# Patient Record
Sex: Female | Born: 1945
Health system: Southern US, Community
[De-identification: ages and names within clinical notes are randomized; demographics above are authoritative.]

## PROBLEM LIST (undated history)

## (undated) DIAGNOSIS — I2510 Atherosclerotic heart disease of native coronary artery without angina pectoris: Secondary | ICD-10-CM

## (undated) DIAGNOSIS — E7800 Pure hypercholesterolemia, unspecified: Secondary | ICD-10-CM

## (undated) DIAGNOSIS — F329 Major depressive disorder, single episode, unspecified: Secondary | ICD-10-CM

## (undated) DIAGNOSIS — M1990 Unspecified osteoarthritis, unspecified site: Secondary | ICD-10-CM

## (undated) DIAGNOSIS — F419 Anxiety disorder, unspecified: Secondary | ICD-10-CM

## (undated) DIAGNOSIS — I10 Essential (primary) hypertension: Secondary | ICD-10-CM

## (undated) DIAGNOSIS — K219 Gastro-esophageal reflux disease without esophagitis: Secondary | ICD-10-CM

## (undated) DIAGNOSIS — Z8249 Family history of ischemic heart disease and other diseases of the circulatory system: Secondary | ICD-10-CM

## (undated) DIAGNOSIS — E785 Hyperlipidemia, unspecified: Secondary | ICD-10-CM

## (undated) DIAGNOSIS — F32A Depression, unspecified: Secondary | ICD-10-CM

## (undated) HISTORY — DX: Hyperlipidemia, unspecified: E78.5

## (undated) HISTORY — PX: BREAST SURGERY: SHX581

## (undated) HISTORY — PX: EYE SURGERY: SHX253

## (undated) HISTORY — DX: Pure hypercholesterolemia, unspecified: E78.00

## (undated) HISTORY — PX: CATARACT EXTRACTION: SUR2

## (undated) HISTORY — DX: Atherosclerotic heart disease of native coronary artery without angina pectoris: I25.10

## (undated) HISTORY — DX: Family history of ischemic heart disease and other diseases of the circulatory system: Z82.49

---

## 1998-09-26 ENCOUNTER — Encounter: Payer: Self-pay | Admitting: Surgery

## 1998-09-26 ENCOUNTER — Ambulatory Visit (HOSPITAL_BASED_OUTPATIENT_CLINIC_OR_DEPARTMENT_OTHER): Admission: RE | Admit: 1998-09-26 | Discharge: 1998-09-26 | Payer: Self-pay | Admitting: Surgery

## 1998-12-20 ENCOUNTER — Other Ambulatory Visit: Admission: RE | Admit: 1998-12-20 | Discharge: 1998-12-20 | Payer: Self-pay | Admitting: Gynecology

## 2000-01-09 ENCOUNTER — Other Ambulatory Visit: Admission: RE | Admit: 2000-01-09 | Discharge: 2000-01-09 | Payer: Self-pay | Admitting: Gynecology

## 2001-02-08 ENCOUNTER — Other Ambulatory Visit: Admission: RE | Admit: 2001-02-08 | Discharge: 2001-02-08 | Payer: Self-pay | Admitting: Gynecology

## 2002-02-15 ENCOUNTER — Other Ambulatory Visit: Admission: RE | Admit: 2002-02-15 | Discharge: 2002-02-15 | Payer: Self-pay | Admitting: Gynecology

## 2002-07-04 ENCOUNTER — Ambulatory Visit (HOSPITAL_COMMUNITY): Admission: RE | Admit: 2002-07-04 | Discharge: 2002-07-04 | Payer: Self-pay | Admitting: Gastroenterology

## 2002-10-13 ENCOUNTER — Encounter: Payer: Self-pay | Admitting: Emergency Medicine

## 2002-10-13 ENCOUNTER — Encounter: Admission: RE | Admit: 2002-10-13 | Discharge: 2002-10-13 | Payer: Self-pay | Admitting: Emergency Medicine

## 2003-02-21 ENCOUNTER — Other Ambulatory Visit: Admission: RE | Admit: 2003-02-21 | Discharge: 2003-02-21 | Payer: Self-pay | Admitting: Gynecology

## 2003-04-24 ENCOUNTER — Encounter: Admission: RE | Admit: 2003-04-24 | Discharge: 2003-04-24 | Payer: Self-pay | Admitting: Emergency Medicine

## 2005-04-26 ENCOUNTER — Encounter: Admission: RE | Admit: 2005-04-26 | Discharge: 2005-04-26 | Payer: Self-pay | Admitting: Emergency Medicine

## 2006-12-17 ENCOUNTER — Encounter: Admission: RE | Admit: 2006-12-17 | Discharge: 2006-12-17 | Payer: Self-pay | Admitting: Emergency Medicine

## 2008-06-08 ENCOUNTER — Other Ambulatory Visit: Admission: RE | Admit: 2008-06-08 | Discharge: 2008-06-08 | Payer: Self-pay | Admitting: Family Medicine

## 2008-12-07 ENCOUNTER — Ambulatory Visit: Payer: Self-pay | Admitting: Diagnostic Radiology

## 2008-12-07 ENCOUNTER — Emergency Department (HOSPITAL_BASED_OUTPATIENT_CLINIC_OR_DEPARTMENT_OTHER): Admission: EM | Admit: 2008-12-07 | Discharge: 2008-12-08 | Payer: Self-pay | Admitting: Emergency Medicine

## 2009-01-01 ENCOUNTER — Encounter: Admission: RE | Admit: 2009-01-01 | Discharge: 2009-02-19 | Payer: Self-pay | Admitting: Orthopedic Surgery

## 2009-07-12 ENCOUNTER — Other Ambulatory Visit: Admission: RE | Admit: 2009-07-12 | Discharge: 2009-07-12 | Payer: Self-pay | Admitting: Family Medicine

## 2010-03-25 ENCOUNTER — Encounter: Payer: Self-pay | Admitting: Emergency Medicine

## 2010-07-16 ENCOUNTER — Other Ambulatory Visit: Payer: Self-pay | Admitting: Family Medicine

## 2010-07-16 ENCOUNTER — Other Ambulatory Visit (HOSPITAL_COMMUNITY)
Admission: RE | Admit: 2010-07-16 | Discharge: 2010-07-16 | Disposition: A | Discharge status: Home / Self Care | Payer: 59 | Source: Ambulatory Visit | Attending: Family Medicine | Admitting: Family Medicine

## 2010-07-16 DIAGNOSIS — Z124 Encounter for screening for malignant neoplasm of cervix: Secondary | ICD-10-CM | POA: Insufficient documentation

## 2010-07-19 NOTE — Op Note (Signed)
NAMEViann Contreras                           ACCOUNT NO.:  [id number]   MEDICAL RECORD NO.:  [id number]                   PATIENT TYPE:  AMB   LOCATION:  ENDO                                 FACILITY:  [facility]   PHYSICIAN:  Anselmo Rod, M.D.               DATE OF BIRTH:  Oct 26, 1945   DATE OF PROCEDURE:  07/04/2002  DATE OF DISCHARGE:                                 OPERATIVE REPORT   PROCEDURE PERFORMED:  Screening colonoscopy.   ENDOSCOPIST:  Anselmo Rod, M.D.   INSTRUMENT USED:  Olympus video colonoscope.   INDICATION FOR PROCEDURE:  [age]-year-old white female with a history  of occasional BRBPR, rule out colon polyps, masses, etc.   PREPROCEDURE PREPARATION:  Informed consent was procured from the patient.  The patient was fasted for eight hours prior to the procedure and prepped  with a bottle of MiraLax and Gatorade the night prior to the procedure.   PREPROCEDURE PHYSICAL:  VITAL SIGNS:  The patient had stable vital signs.  NECK:  Neck supple.  CHEST:  Chest clear to auscultation.  S1 and S2 regular.  ABDOMEN:  Abdomen soft with normal bowel sounds.   DESCRIPTION OF THE PROCEDURE:  The patient was placed in the left lateral  decubitus position and sedated with 100 mg of Demerol and 10 mg of Versed  intravenously.  Once the patient was adequately sedate and maintained on low-  flow oxygen and continuous cardiac monitoring, the Olympus video colonoscope  was advanced from the rectum to the cecum with difficulty; there was a large  amount of residual stool in the colon and multiple washings were done.  The  appendicular orifice and ileocecal valve were clearly visualized and  photographed.  No masses, polyps, erosions, ulcerations or diverticula were  seen.  Small internal hemorrhoids were seen on retroflexion in the rectum  and small lesions could have been missed secondary to relatively poor prep.   IMPRESSION:  1. No masses or polyps seen.  No evidence  of diverticulosis.  2. Small non-bleeding internal hemorrhoids.    RECOMMENDATIONS:  1. A high-fiber diet with 20 to 25 g of fiber in her diet and liberal fluid     intake have been advocated.  2. Repeat colorectal cancer screening is recommended in the next five years     and if the patient develops any abnormal symptoms in the interim.                                               Anselmo Rod, M.D.    JNM/MEDQ  D:  07/04/2002  T:  07/04/2002  Job:  [id number]   cc:   Reuben Likes, M.D.  [address]  [place]  [place] [address]  Fax: [phone]

## 2011-04-09 DIAGNOSIS — H18603 Keratoconus, unspecified, bilateral: Secondary | ICD-10-CM

## 2011-04-09 DIAGNOSIS — H35341 Macular cyst, hole, or pseudohole, right eye: Secondary | ICD-10-CM | POA: Insufficient documentation

## 2011-04-09 HISTORY — DX: Keratoconus, unspecified, bilateral: H18.603

## 2011-04-09 HISTORY — DX: Macular cyst, hole, or pseudohole, right eye: H35.341

## 2011-10-09 DIAGNOSIS — H33332 Multiple defects of retina without detachment, left eye: Secondary | ICD-10-CM

## 2011-10-09 DIAGNOSIS — Z961 Presence of intraocular lens: Secondary | ICD-10-CM

## 2011-10-09 HISTORY — DX: Multiple defects of retina without detachment, left eye: H33.332

## 2011-10-09 HISTORY — DX: Presence of intraocular lens: Z96.1

## 2011-11-18 ENCOUNTER — Other Ambulatory Visit (HOSPITAL_COMMUNITY)
Admission: RE | Admit: 2011-11-18 | Discharge: 2011-11-18 | Disposition: A | Discharge status: Home / Self Care | Payer: 59 | Source: Ambulatory Visit | Attending: Obstetrics and Gynecology | Admitting: Obstetrics and Gynecology

## 2011-11-18 ENCOUNTER — Other Ambulatory Visit: Payer: Self-pay | Admitting: Obstetrics and Gynecology

## 2011-11-18 DIAGNOSIS — Z01419 Encounter for gynecological examination (general) (routine) without abnormal findings: Secondary | ICD-10-CM | POA: Insufficient documentation

## 2011-12-03 DIAGNOSIS — Z7189 Other specified counseling: Secondary | ICD-10-CM | POA: Insufficient documentation

## 2011-12-03 HISTORY — DX: Other specified counseling: Z71.89

## 2012-03-31 ENCOUNTER — Other Ambulatory Visit: Payer: Self-pay | Admitting: Dermatology

## 2012-04-14 ENCOUNTER — Other Ambulatory Visit: Payer: Self-pay | Admitting: Dermatology

## 2012-05-12 ENCOUNTER — Other Ambulatory Visit: Payer: Self-pay | Admitting: Dermatology

## 2012-05-14 DIAGNOSIS — H2512 Age-related nuclear cataract, left eye: Secondary | ICD-10-CM

## 2012-05-14 HISTORY — DX: Age-related nuclear cataract, left eye: H25.12

## 2012-06-01 ENCOUNTER — Other Ambulatory Visit: Payer: Self-pay | Admitting: Radiology

## 2012-11-17 ENCOUNTER — Other Ambulatory Visit (HOSPITAL_COMMUNITY)
Admission: RE | Admit: 2012-11-17 | Discharge: 2012-11-17 | Disposition: A | Discharge status: Home / Self Care | Payer: 59 | Source: Ambulatory Visit | Attending: Obstetrics and Gynecology | Admitting: Obstetrics and Gynecology

## 2012-11-17 ENCOUNTER — Other Ambulatory Visit: Payer: Self-pay | Admitting: Obstetrics and Gynecology

## 2012-11-17 DIAGNOSIS — Z01419 Encounter for gynecological examination (general) (routine) without abnormal findings: Secondary | ICD-10-CM | POA: Insufficient documentation

## 2012-11-17 DIAGNOSIS — Z1151 Encounter for screening for human papillomavirus (HPV): Secondary | ICD-10-CM | POA: Insufficient documentation

## 2013-11-22 ENCOUNTER — Other Ambulatory Visit: Payer: Self-pay | Admitting: Obstetrics and Gynecology

## 2013-11-22 ENCOUNTER — Other Ambulatory Visit (HOSPITAL_COMMUNITY)
Admission: RE | Admit: 2013-11-22 | Discharge: 2013-11-22 | Disposition: A | Discharge status: Home / Self Care | Payer: Medicare Other | Source: Ambulatory Visit | Attending: Obstetrics and Gynecology | Admitting: Obstetrics and Gynecology

## 2013-11-22 DIAGNOSIS — Z124 Encounter for screening for malignant neoplasm of cervix: Secondary | ICD-10-CM | POA: Insufficient documentation

## 2013-11-24 LAB — CYTOLOGY - PAP

## 2014-05-15 ENCOUNTER — Ambulatory Visit (INDEPENDENT_AMBULATORY_CARE_PROVIDER_SITE_OTHER): Payer: Medicare Other

## 2014-05-15 ENCOUNTER — Ambulatory Visit (INDEPENDENT_AMBULATORY_CARE_PROVIDER_SITE_OTHER): Payer: Medicare Other | Admitting: Podiatry

## 2014-05-15 VITALS — BP 178/91 | HR 65 | Resp 16

## 2014-05-15 DIAGNOSIS — M79671 Pain in right foot: Secondary | ICD-10-CM

## 2014-05-15 DIAGNOSIS — M722 Plantar fascial fibromatosis: Secondary | ICD-10-CM

## 2014-05-15 MED ORDER — TRIAMCINOLONE ACETONIDE 10 MG/ML IJ SUSP
10.0000 mg | Freq: Once | INTRAMUSCULAR | Status: AC
  Administered 2014-05-15: 10 mg

## 2014-05-15 NOTE — Progress Notes (Signed)
   Subjective:    Patient ID: Krista Contreras, female    DOB: [birth year], 69 y.o.   MRN: [id number]  HPI  Pt presents with ongoing right heel pain, worsens with walking and standing  Review of Systems  Cardiovascular: Positive for palpitations.  Hematological: Bruises/bleeds easily.  All other systems reviewed and are negative.      Objective:   Physical Exam        Assessment & Plan:

## 2014-05-15 NOTE — Patient Instructions (Signed)

## 2014-05-16 NOTE — Progress Notes (Signed)
Subjective:     Patient ID: Krista Contreras, female   DOB: 1945/03/30, 69 y.o.   MRN: [id number]  HPI patient states I been having a lot of pain in my right heel for a while and it's worsened over 2 weeks and it's making it hard to walk comfortably and it hurts very badly when I get up in the morning   Review of Systems  All other systems reviewed and are negative.      Objective:   Physical Exam  Constitutional: She is oriented to person, place, and time.  Cardiovascular: Intact distal pulses.   Musculoskeletal: Normal range of motion.  Neurological: She is oriented to person, place, and time.  Skin: Skin is warm.  Nursing note and vitals reviewed.  neurovascular status intact with muscle strength adequate and range of motion within normal limits. Patient's noted to have exquisite discomfort plantar aspect right heel at the insertion of the tendon into the calcaneus with inflammation and fluid around the medial band patient is well perfused as far as digits and does have no equinus condition and is well oriented 3     Assessment:     Plantar fasciitis of an acute nature right heel with moderate depression of the arch    Plan:     H&P and x-ray reviewed. Injected the right plantar fascia 3 mg Kenalog 5 mg Xylocaine and applied fascial brace with instructions on usage. Gave physical therapy instructions and advised on wider supportive shoes and reappoint to recheck in one week

## 2014-05-22 ENCOUNTER — Ambulatory Visit (INDEPENDENT_AMBULATORY_CARE_PROVIDER_SITE_OTHER): Payer: Medicare Other | Admitting: Podiatry

## 2014-05-22 VITALS — BP 171/96 | HR 73 | Resp 16

## 2014-05-22 DIAGNOSIS — M722 Plantar fascial fibromatosis: Secondary | ICD-10-CM

## 2014-05-22 MED ORDER — TRIAMCINOLONE ACETONIDE 10 MG/ML IJ SUSP
10.0000 mg | Freq: Once | INTRAMUSCULAR | Status: AC
  Administered 2014-05-22: 10 mg

## 2014-05-22 NOTE — Progress Notes (Signed)
Subjective:     Patient ID: Krista Contreras, female   DOB: [birth year], 69 y.o.   MRN: [id number]  HPI patient presents stating my right heel is improved but still sore when I do some walking but the sharp pain has reduced quite a bit   Review of Systems     Objective:   Physical Exam Neurovascular status intact with significant reduction of discomfort right plantar heel at the insertional point of the tendon into the calcaneus    Assessment:     Plantar fasciitis right still present but improved    Plan:     Reinjected the plantar fascia 3 mg Kenalog 5 mg Xylocaine and advised on physical therapy ice therapy and supportive shoe and reappoint if symptoms persist

## 2014-08-31 ENCOUNTER — Other Ambulatory Visit: Payer: Self-pay | Admitting: Cardiology

## 2014-08-31 ENCOUNTER — Encounter: Payer: Self-pay | Admitting: Cardiology

## 2014-08-31 DIAGNOSIS — Z8249 Family history of ischemic heart disease and other diseases of the circulatory system: Secondary | ICD-10-CM

## 2014-09-07 ENCOUNTER — Ambulatory Visit
Admission: RE | Admit: 2014-09-07 | Discharge: 2014-09-07 | Disposition: A | Discharge status: Home / Self Care | Payer: No Typology Code available for payment source | Source: Ambulatory Visit | Attending: Cardiology | Admitting: Cardiology

## 2014-09-07 DIAGNOSIS — Z8249 Family history of ischemic heart disease and other diseases of the circulatory system: Secondary | ICD-10-CM

## 2014-10-05 ENCOUNTER — Encounter: Payer: Self-pay | Admitting: Cardiology

## 2014-10-05 DIAGNOSIS — R9430 Abnormal result of cardiovascular function study, unspecified: Secondary | ICD-10-CM

## 2014-10-05 DIAGNOSIS — E785 Hyperlipidemia, unspecified: Secondary | ICD-10-CM

## 2014-10-05 DIAGNOSIS — Z8249 Family history of ischemic heart disease and other diseases of the circulatory system: Secondary | ICD-10-CM

## 2014-10-05 DIAGNOSIS — I2510 Atherosclerotic heart disease of native coronary artery without angina pectoris: Secondary | ICD-10-CM

## 2014-10-20 ENCOUNTER — Other Ambulatory Visit: Payer: Self-pay | Admitting: Cardiology

## 2014-10-20 DIAGNOSIS — I2510 Atherosclerotic heart disease of native coronary artery without angina pectoris: Secondary | ICD-10-CM

## 2014-10-26 ENCOUNTER — Other Ambulatory Visit: Payer: Self-pay | Admitting: Cardiology

## 2014-10-27 ENCOUNTER — Encounter: Payer: Self-pay | Admitting: Cardiology

## 2014-10-27 ENCOUNTER — Other Ambulatory Visit: Payer: Self-pay | Admitting: Cardiology

## 2014-10-27 ENCOUNTER — Inpatient Hospital Stay: Admission: RE | Admit: 2014-10-27 | Payer: Self-pay | Source: Ambulatory Visit

## 2014-10-27 DIAGNOSIS — Z8249 Family history of ischemic heart disease and other diseases of the circulatory system: Secondary | ICD-10-CM | POA: Insufficient documentation

## 2014-10-27 DIAGNOSIS — I2510 Atherosclerotic heart disease of native coronary artery without angina pectoris: Secondary | ICD-10-CM | POA: Insufficient documentation

## 2014-10-27 DIAGNOSIS — E785 Hyperlipidemia, unspecified: Secondary | ICD-10-CM | POA: Insufficient documentation

## 2014-10-27 DIAGNOSIS — E7800 Pure hypercholesterolemia, unspecified: Secondary | ICD-10-CM | POA: Insufficient documentation

## 2014-10-27 DIAGNOSIS — R9430 Abnormal result of cardiovascular function study, unspecified: Secondary | ICD-10-CM | POA: Insufficient documentation

## 2014-10-27 HISTORY — DX: Abnormal result of cardiovascular function study, unspecified: R94.30

## 2014-10-27 NOTE — Progress Notes (Unsigned)
Patient ID: Krista Contreras, female   DOB: Mar 31, 1945, 69 y.o.   MRN: [id number]   Phill Mutter D  Date of visit:  10/05/2014 DOB:  December 28, 1945    Age:  69 yrs. Medical record number:  [id number]     Account number:  [id number] Primary Care Provider: [place]  1. CAD Native without angina  2. Elevated blood-pressure reading, without diagnosis of hypertension  3. Abnormal result of cardiovascular function study, unspecified  4. Hyperlipidemia  5. Family history of ischemic heart disease and other diseases of the circulatory system  6. Overweight  TREADMILL  The patient exercised on the standard Bruce protocol a total of 8 minutes into Bruce stage 3 achieving a workload of 9 METS. The test was stopped due to dyspnea and fatigue. The patient had no chest pain suggestive of angina. Heart rate rose to 161 which was 105% of predicted maximum. Blood pressure response was normal.  12-lead EKG is normal at rest. With exercise the patient achieved target heart rate and had 1 mm ST segment depression consistent with ischemia in the lateral leads associated with T wave changes in recovery. No arrhythmias occurred.  IMPRESSIONS:  1. Clinically negative for ischemia 2. EKG positive for ischemia at a moderate workload but with no symptoms  RECOMMENDATIONS:  The patient had a cardiac calcium score of 281 with calcification seen in the proximal LAD and proximal to mid right coronary artery. She has an abnormal exercise treadmill test.  She has a strongly positive family history of cardiac disease. She also has hyperlipidemia as well as labile hypertension. I would recommend that she have an evaluation for coronary artery disease. We discussed catheterization versus CTA and she would prefer at this time since she is asymptomatic and she had a noninvasive approach. We will try to get a CTA to evaluate for the extent of coronary artery disease to help guide plan further therapy in  light of the abnormal treadmill test. She is already taking a high intensity statin and may be a candidate for ACE inhibitors.   MOST RECENT LIPID PANEL 08/03/14  CHOL TOTL 174 mg/dl, LDL 100 NM, HDL 49 mg/dl, TRIGLYCER 124 mg/dl, ALT 25 u/l, ALK PHOS 89 u/l and AST 19 u/l  TODAYS ORDERS  1. Cardiac CTA: First Available   Cardiology Physician:  Kerry Hough. MD [facility]

## 2014-10-27 NOTE — Progress Notes (Signed)
Patient ID: Krista Contreras, female   DOB: 1945/12/06, 69 y.o.   MRN: [id number]   Krista Contreras  Date of visit:  08/31/2014 DOB:  [birth year]    Age:  69 yrs. Medical record number:  [id number]     Account number:  [id number] Primary Care Provider: [facility] ____________________________ CURRENT DIAGNOSES  1. Elevated blood-pressure reading, without diagnosis of hypertension  2. Family history of ischemic heart disease and other diseases of the circulatory system  3. Hyperlipidemia  4. Palpitations  5. Overweight ____________________________ ALLERGIES  No Known Allergies ____________________________ MEDICATIONS  1. aspirin 81 mg chewable tablet, 1 p.o. daily  2. Vitamin D3 1,000 unit chewable tablet, 1 p.o. daily  3. lorazepam 1 mg tablet, PRN  4. atorvastatin 40 mg tablet, 1 p.o. daily  5. fluocinonide 0.05 % topical ointment, Take as directed  6. Inderal LA 60 mg capsule,extended release, 1 p.o. daily ____________________________ CHIEF COMPLAINTS  Family hx cad ____________________________ HISTORY OF PRESENT ILLNESS This very nice 69 year old female is seen for assessment of cardiac risk in light of a family history of cardiac disease. She has a long-standing history of palpitations. She evidently had an evaluation for this a few years ago and was placed on Inderal but has mostly been successful in controlling her palpitations. She denies angina and does not get much in the way of regular exercise. She is somewhat stressed caring for her father who has had a previous brain bleed. Her mother had a myocardial infarction at age 69 her father had a heart attack in his 41s. Her brother recently had bypass grafting in his 21s. She has long-standing hyperlipidemia and currently takes atorvastatin 40 mg daily. She does not get much in the way of regular exercise. She is about 10 pounds overweight. She normally denies PND, orthopnea, or edema. She has episodic palpitations as noted  above.  ____________________________ PAST HISTORY  Past Medical Illnesses:  hyperlipidemia;  Cardiovascular Illnesses:  no previous history of cardiac disease.;  Surgical Procedures:  cataract extraction OD, tubal ligation, breast surg;  NYHA Classification:  I;  Canadian Angina Classification:  Class 0: Asymptomatic;  Cardiology Procedures-Invasive:  no history of prior cardiac procedures;  Cardiology Procedures-Noninvasive:  no previous non-invasive procedures;  LVEF not documented,   ____________________________ CARDIO-PULMONARY TEST DATES EKG Date:  08/31/2014;   ____________________________ FAMILY HISTORY Brother -- Brother alive with problem, Hypertension, Coronary artery bypass grafting Brother -- Brother alive and well Father -- Father alive with problem, Heart Attack Mother -- Mother dead, Congestive heart failure, Neoplasm of uterus, Heart Attack ____________________________ SOCIAL HISTORY Alcohol Use:  no alcohol use;  Smoking:  never smoked;  Diet:  regular diet;  Lifestyle:  widowed;  Exercise:  no regular exercise;  Occupation:  retired and IT trainer;   ____________________________ REVIEW OF SYSTEMS General:  weight gain of approximately 10 lbs  Integumentary:no rashes or new skin lesions. Eyes: history of macular hole and retinal tears Ears, Nose, Throat, Mouth:  denies any hearing loss, epistaxis, hoarseness or difficulty speaking. Respiratory: denies dyspnea, cough, wheezing or hemoptysis. Cardiovascular:  please review HPI Abdominal: occasional dyspepsiaGenitourinary-Female: no dysuria, urgency, frequency, UTIs, or stress incontinence Musculoskeletal:  denies arthritis, venous insufficiency, or muscle weakness Neurological:  denies headaches, stroke, or TIA Psychiatric:  anxiety Hematological/Immunologic:  denies any food allergies, bleeding disorders. ____________________________ PHYSICAL EXAMINATION VITAL SIGNS  Blood Pressure:  150/74 Sitting,  Left arm, regular cuff  , 156/80 Standing, Left arm and regular cuff   Pulse:  76/min.  Weight:  139.00 lbs. Height:  60"BMI: 27  Constitutional:  pleasant white female, in no acute distress Skin:  warm and dry to touch, no apparent skin lesions, or masses noted. Head:  normocephalic, normal hair pattern, no masses or tenderness Eyes:  EOMS Intact, PERRLA, C and S clear, Funduscopic exam not done. ENT:  ears, nose and throat reveal no gross abnormalities.  Dentition good. Neck:  supple, without massess. No JVD, thyromegaly or carotid bruits. Carotid upstroke normal. Chest:  normal symmetry, clear to auscultation. Cardiac:  regular rhythm, normal S1 and S2, No S3 or S4, no murmurs, gallops or rubs detected. Abdomen:  abdomen soft,non-tender, no masses, no hepatospenomegaly, or aneurysm noted Peripheral Pulses:  the femoral,dorsalis pedis, and posterior tibial pulses are full and equal bilaterally with no bruits auscultated. Extremities & Back:  no deformities, clubbing, cyanosis, erythema or edema observed. Normal muscle strength and tone. Neurological:  no gross motor or sensory deficits noted, affect appropriate, oriented x3. ____________________________ MOST RECENT LIPID PANEL 08/03/14  CHOL TOTL 174 mg/dl, LDL 132 NM, HDL 49 mg/dl, TRIGLYCER 440 mg/dl ____________________________ IMPRESSIONS/PLAN  1. Episodic palpitations 2. Hyperlipidemia 3. Abnormal family history of cardiovascular disease 4. Overweight 5. Elevation of blood pressure without prior diagnosis of hypertension  Recommendations:  The patient has no cardiovascular symptoms. Her estimated ten-year risk of cardiac events is 8.2% which is mildly elevated for age. She has an impressive family history and is not currently getting much in the way of exercise. I recommended that she have a treadmill stress test prior to starting an exercise program and that she lose about 10 pounds of weight. We discussed following a  Mediterranean type diet. Also like for her to have a cardiac calcium score to further aid in risk stratification of her overall risks and she is agreeable to do this. Thank you for asking me to see her with you. I did ask her to monitor her blood pressure at home. Ideally her systolic blood pressure should be consistently below 130 to optimize her cardiovascular risk. EKG is normal.  ____________________________ TODAYS ORDERS  1. 12 Lead EKG: Today  2. treadmill:  Regular TM At [facility]  3. Cardiac calcium score: 0                       ____________________________ Cardiology Physician:  Darden Palmer MD [facility]

## 2014-10-31 ENCOUNTER — Telehealth: Payer: Self-pay | Admitting: *Deleted

## 2014-10-31 NOTE — Telephone Encounter (Signed)
Erroneous encounter

## 2014-11-01 ENCOUNTER — Encounter: Payer: Self-pay | Admitting: Cardiology

## 2014-11-01 ENCOUNTER — Other Ambulatory Visit: Payer: Self-pay | Admitting: Cardiology

## 2014-11-01 DIAGNOSIS — R9439 Abnormal result of other cardiovascular function study: Secondary | ICD-10-CM

## 2014-11-01 DIAGNOSIS — I2510 Atherosclerotic heart disease of native coronary artery without angina pectoris: Secondary | ICD-10-CM

## 2014-11-07 ENCOUNTER — Ambulatory Visit (HOSPITAL_COMMUNITY)
Admission: RE | Admit: 2014-11-07 | Discharge: 2014-11-07 | Disposition: A | Discharge status: Home / Self Care | Payer: Medicare Other | Source: Ambulatory Visit | Attending: Cardiology | Admitting: Cardiology

## 2014-11-07 ENCOUNTER — Encounter (HOSPITAL_COMMUNITY): Payer: Self-pay

## 2014-11-07 DIAGNOSIS — R9439 Abnormal result of other cardiovascular function study: Secondary | ICD-10-CM | POA: Diagnosis not present

## 2014-11-07 DIAGNOSIS — R911 Solitary pulmonary nodule: Secondary | ICD-10-CM | POA: Diagnosis not present

## 2014-11-07 DIAGNOSIS — R079 Chest pain, unspecified: Secondary | ICD-10-CM | POA: Insufficient documentation

## 2014-11-07 DIAGNOSIS — I2510 Atherosclerotic heart disease of native coronary artery without angina pectoris: Secondary | ICD-10-CM | POA: Diagnosis not present

## 2014-11-07 MED ORDER — NITROGLYCERIN 0.4 MG SL SUBL
SUBLINGUAL_TABLET | SUBLINGUAL | Status: AC
  Administered 2014-11-07: 0.4 mg via SUBLINGUAL
  Filled 2014-11-07: qty 1

## 2014-11-07 MED ORDER — NITROGLYCERIN 0.4 MG SL SUBL
0.4000 mg | SUBLINGUAL_TABLET | SUBLINGUAL | Status: DC | PRN
  Administered 2014-11-07: 0.4 mg via SUBLINGUAL

## 2014-11-07 MED ORDER — METOPROLOL TARTRATE 1 MG/ML IV SOLN
INTRAVENOUS | Status: AC
  Administered 2014-11-07: 5 mg via INTRAVENOUS
  Filled 2014-11-07: qty 10

## 2014-11-07 MED ORDER — IOHEXOL 350 MG/ML SOLN
80.0000 mL | Freq: Once | INTRAVENOUS | Status: AC | PRN
  Administered 2014-11-07: 80 mL via INTRAVENOUS

## 2014-11-07 MED ORDER — METOPROLOL TARTRATE 1 MG/ML IV SOLN
5.0000 mg | INTRAVENOUS | Status: DC | PRN
  Administered 2014-11-07 (×2): 5 mg via INTRAVENOUS
  Filled 2014-11-07 (×3): qty 5

## 2015-12-04 ENCOUNTER — Other Ambulatory Visit (HOSPITAL_COMMUNITY)
Admission: RE | Admit: 2015-12-04 | Discharge: 2015-12-04 | Disposition: A | Discharge status: Home / Self Care | Payer: Medicare Other | Source: Ambulatory Visit | Attending: Obstetrics and Gynecology | Admitting: Obstetrics and Gynecology

## 2015-12-04 ENCOUNTER — Other Ambulatory Visit: Payer: Self-pay | Admitting: Obstetrics and Gynecology

## 2015-12-04 DIAGNOSIS — Z1151 Encounter for screening for human papillomavirus (HPV): Secondary | ICD-10-CM | POA: Insufficient documentation

## 2015-12-04 DIAGNOSIS — Z01419 Encounter for gynecological examination (general) (routine) without abnormal findings: Secondary | ICD-10-CM | POA: Insufficient documentation

## 2015-12-07 LAB — CYTOLOGY - PAP

## 2016-10-25 IMAGING — CT CT HEART MORP W/ CTA COR W/ SCORE W/ CA W/CM &/OR W/O CM
1 of 10 series · 1 of 20 positions shown, 2 images · non-contrast
Comparison: Coronary calcium scan 09/07/2014.

CLINICAL DATA: Chest pain

EXAM:
Cardiac/Coronary  CT
TECHNIQUE: The patient was scanned on a Philips 256 scanner.

[Series 300: locator · axial · 0.35mm/px · z∈[-139,-139]mm · 1 of 1 slices shown, 2 images]
[im 1/1  vessel]
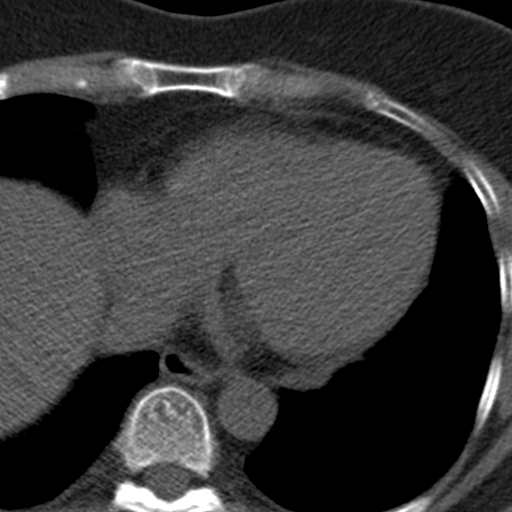
[im 1/1  lung]
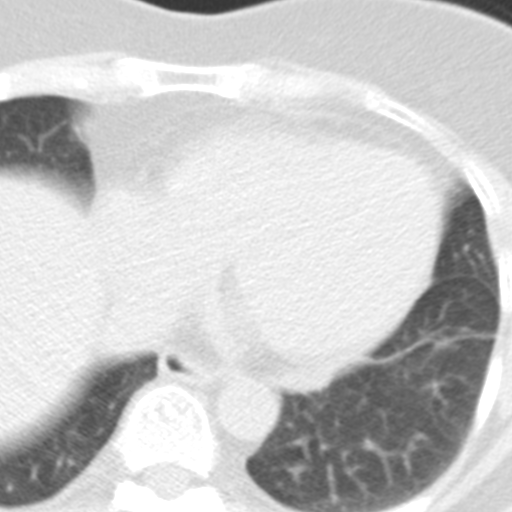

[1 of 20 positions shown; findings below may reference images not displayed]

FINDINGS: A 120 kV prospective scan was triggered in the descending thoracic
aorta at 111 HU's. Axial non-contrast 3 mm slices were carried out
through the heart. The data set was analyzed on a dedicated work
station and scored using the Agatson method. Gantry rotation speed
was 270 msecs and collimation was .9 mm. 10 mg of iv Metoprolol and
0.4 mg of sl NTG was given. The 3D data set was reconstructed in 5%
intervals of the 67-82 % of the R-R cycle. Diastolic phases were
analyzed on a dedicated work station using MPR, MIP and VRT modes.
The patient received 80 cc of contrast.

Aorta: Normal caliber. No dissection. Minimal diffuse calcifications
in the descending thoracic aorta.

Aortic Valve:  Trileaflet.  No calcifications.

Coronary Arteries:  Normal origin.  Right dominance.

RCA is a large dominant vessel that gives rise to PDA and PLA. There
is minimal calcified plaque in the proximal segment with associated
stenosis 0-25%.

Left main is a large caliber long vessel that gives rise to LAD and
LCX arteries. Distal left main has minimal calcified plaque with
associated 0-25% stenosis.

LAD is a large caliber vessel that gives rise to two small diagonal
branches. There is a long moderate mixed plaque that originates in
the ostial LAD and extends to the mid LAD. The maximum stenosis is
50-69%. Distal LAD is rather small and has no significant plaque.

LCX artery is a medium caliber vessel that gives rise to two obtuse
marginal branches. There is no significant plaque.

Other findings:

Normal pulmonary vein drainage into the left atrium.

No ASD or VSD identified.

Normal left atrial appendage size with no filling defect.
IMPRESSION: 1. Coronary calcium score of 273. This was 86 percentile for age and
sex matched control.

2. Normal coronary origin.  Right dominance.

3. There is moderate mixed plaque with high risk features (napkin
ring sign) in the proximal to mid LAD with associated stenosis
50-69%. An aggressive risk factor modification is recommended.

Fumitaka Egami

EXAM:
OVER-READ INTERPRETATION  CT CHEST

The following report is an over-read performed by radiologist Dr.
over-read does not include interpretation of cardiac or coronary
anatomy or pathology. The coronary calcium score/coronary CTA
interpretation by the cardiologist is attached.
FINDINGS: 2 mm nodule in the right middle lobe (image 44 of series 204) is
highly nonspecific. Within the visualized portions of the thorax
there are no larger more suspicious appearing pulmonary nodules or
masses. Scattered linear opacities in the left lower lobe and right
middle lobe, most compatible with areas of subsegmental atelectasis
and/or scarring. Within the visualized thorax there is no acute
consolidative airspace disease, no pneumothorax and no
lymphadenopathy. Visualized portions of the upper abdomen are
unremarkable. There are no aggressive appearing lytic or blastic
lesions noted in the visualized portions of the skeleton.
IMPRESSION: 1. No significant incidental noncardiac findings noted.
2. 2 mm right middle lobe nodule is highly nonspecific and
statistically likely benign. If the patient is at high risk for
bronchogenic carcinoma, follow-up chest CT at 1 year is recommended.
If the patient is at low risk, no follow-up is needed. This
recommendation follows the consensus statement: Guidelines for
Management of Small Pulmonary Nodules Detected on CT Scans: A
Statement from the [HOSPITAL] as published in Radiology

## 2017-01-08 ENCOUNTER — Encounter: Payer: Self-pay | Admitting: Cardiology

## 2017-01-08 DIAGNOSIS — I493 Ventricular premature depolarization: Secondary | ICD-10-CM

## 2017-01-08 HISTORY — DX: Ventricular premature depolarization: I49.3

## 2017-06-15 ENCOUNTER — Other Ambulatory Visit: Payer: Self-pay | Admitting: Radiology

## 2017-07-16 ENCOUNTER — Other Ambulatory Visit: Payer: Self-pay | Admitting: General Surgery

## 2017-07-16 DIAGNOSIS — R928 Other abnormal and inconclusive findings on diagnostic imaging of breast: Secondary | ICD-10-CM

## 2017-07-19 NOTE — H&P (Signed)
Krista Contreras Location: [facility] Surgery Patient #: [id number] DOB: Mar 16, 1945 Married / Language: Undefined / Race: Refused to Report/Unreported Female        History of Present Illness       . This is a 72 year old female, referred by Dr. Latricia Heft at [facility] imaging for evaluation of complex sclerosing lesion right breast upper outer quadrant. Krista Contreras is her PCP. Krista Contreras is her cardiologist.      She gets yearly screening mammograms. She had a radial scar excised in the left breast 15-20 years ago but she does not remember the name of the surgeon. She is asymptomatic. Recent imaging studies showed a distorted area in the upper outer quadrant of the right breast. This was biopsied and shows complex sclerosing lesion. Excision was recommended. She would like this area excised.      Past history significant for coronary artery disease. She's never had a catheter but she did have a treadmill. She has hypertension on 3 medications. No chest pain or shortness of breath. Hyperlipidemia. Family history reveals breast cancer in a paternal aunt in her 65s. No other breast, ovarian cancer. A maternal cousin had colon cancer. Mother died of heart disease. Father died at 71 of pneumonia. He had fallen and suffered a stroke secondary to the fall. Social history reveals she was remarried one year ago. Her husband is with her throughout the encounter. They live in [place]. She has 2 children by her first marriage and he has 2 children by his first marriage. She is retired. Denies tobacco or alcohol.      She'll be scheduled for right breast lumpectomy with radioactive seed localization. I discussed indications, details, techniques, and risk of the surgery with her and her husband in detail. She is aware the risks of bleeding, infection, cosmetic deformity, nerve damage , chronic pain, reoperative surgery if cancer, and other unforeseen problems. She understands all these  issues. All of her questions are answered. She agrees with this plan.   Addendum Note Preoperative orders including seed placement entered into Epic EMR   Past Surgical History Breast Biopsy  Bilateral. multiple Cataract Surgery  Right. Colon Polyp Removal - Colonoscopy   Diagnostic Studies History Colonoscopy  1-5 years ago Mammogram  within last year  Allergies  No Known Drug Allergies  Allergies Reconciled   Medication History Atorvastatin Calcium (  Tablet, Oral) Active. Propranolol HCl ER (  Capsule ER 24HR, Oral) Active. Ramipril (  Capsule, Oral) Active. Sertraline HCl (  Tablet, Oral) Active. Vitamin D (2000UNIT Tablet, Oral) Active. Aspirin (  Tablet, Oral) Active. Medications Reconciled  Social History  No alcohol use  No caffeine use  No drug use  Tobacco use  Never smoker.  Family History  Alcohol Abuse  Family Members In General. Breast Cancer  Family Members In General. Heart Disease  Mother. Hypertension  Father.  Pregnancy / Birth History  Age at menarche  14 years. Age of menopause  <47 Contraceptive History  Oral contraceptives. Gravida  2 Maternal age  64-25 Para  2  Other Problems  Anxiety Disorder  Gastroesophageal Reflux Disease  High blood pressure  Hypercholesterolemia     Review of Systems  General Not Present- Appetite Loss, Chills, Fatigue, Fever, Night Sweats, Weight Gain and Weight Loss. Skin Not Present- Change in Wart/Mole, Dryness, Hives, Jaundice, New Lesions, Non-Healing Wounds, Rash and Ulcer. HEENT Present- Wears glasses/contact lenses. Not Present- Earache, Hearing Loss, Hoarseness, Nose Bleed, Oral Ulcers, Ringing in the Ears, Seasonal Allergies, Sinus Pain, Sore Throat,  Visual Disturbances and Yellow Eyes. Respiratory Not Present- Bloody sputum, Chronic Cough, Difficulty Breathing, Snoring and Wheezing. Breast Not Present- Breast Mass, Breast Pain, Nipple Discharge and  Skin Changes. Cardiovascular Present- Palpitations. Not Present- Chest Pain, Difficulty Breathing Lying Down, Leg Cramps, Rapid Heart Rate, Shortness of Breath and Swelling of Extremities. Gastrointestinal Not Present- Abdominal Pain, Bloating, Bloody Stool, Change in Bowel Habits, Chronic diarrhea, Constipation, Difficulty Swallowing, Excessive gas, Gets full quickly at meals, Hemorrhoids, Indigestion, Nausea, Rectal Pain and Vomiting. Female Genitourinary Not Present- Frequency, Nocturia, Painful Urination, Pelvic Pain and Urgency. Musculoskeletal Not Present- Back Pain, Joint Pain, Joint Stiffness, Muscle Pain, Muscle Weakness and Swelling of Extremities. Neurological Not Present- Decreased Memory, Fainting, Headaches, Numbness, Seizures, Tingling, Tremor, Trouble walking and Weakness. Psychiatric Present- Anxiety. Not Present- Bipolar, Change in Sleep Pattern, Depression, Fearful and Frequent crying. Endocrine Not Present- Cold Intolerance, Excessive Hunger, Hair Changes, Heat Intolerance, Hot flashes and New Diabetes. Hematology Present- Blood Thinners. Not Present- Easy Bruising, Excessive bleeding, Gland problems, HIV and Persistent Infections.  Vitals  Weight: 145.2 lb Height: 60in Body Surface Area: 1.63 m Body Mass Index: 28.36 kg/m  Temp.: 98.64F  Pulse: 72 (Regular)  BP: 132/84 (Sitting, Left Arm, Standard)     Physical Exam  General Mental Status-Alert. General Appearance-Consistent with stated age. Hydration-Well hydrated. Voice-Normal.  Head and Neck Head-normocephalic, atraumatic with no lesions or palpable masses. Trachea-midline. Thyroid Gland Characteristics - normal size and consistency.  Eye Eyeball - Bilateral-Extraocular movements intact. Sclera/Conjunctiva - Bilateral-No scleral icterus.  Chest and Lung Exam Chest and lung exam reveals -quiet, even and easy respiratory effort with no use of accessory muscles and on  auscultation, normal breath sounds, no adventitious sounds and normal vocal resonance. Inspection Chest Wall - Normal. Back - normal.  Breast Note: Breasts are medium to large in size. Ecchymoses lateral right breast. Otherwise no skin change mass or axillary adenopathy on either side. There is a circumareolar scar medial left breast well healed. Nipple and areola complexes looked normal   Cardiovascular Cardiovascular examination reveals -normal heart sounds, regular rate and rhythm with no murmurs and normal pedal pulses bilaterally.  Abdomen Inspection Inspection of the abdomen reveals - No Hernias. Skin - Scar - no surgical scars. Palpation/Percussion Palpation and Percussion of the abdomen reveal - Soft, Non Tender, No Rebound tenderness, No Rigidity (guarding) and No hepatosplenomegaly. Auscultation Auscultation of the abdomen reveals - Bowel sounds normal.  Neurologic Neurologic evaluation reveals -alert and oriented x 3 with no impairment of recent or remote memory. Mental Status-Normal.  Musculoskeletal Normal Exam - Left-Upper Extremity Strength Normal and Lower Extremity Strength Normal. Normal Exam - Right-Upper Extremity Strength Normal and Lower Extremity Strength Normal.  Lymphatic Head & Neck  General Head & Neck Lymphatics: Bilateral - Description - Normal. Axillary  General Axillary Region: Bilateral - Description - Normal. Tenderness - Non Tender. Femoral & Inguinal  Generalized Femoral & Inguinal Lymphatics: Bilateral - Description - Normal. Tenderness - Non Tender.    Assessment & Plan ABNORMALITY OF RIGHT BREAST ON SCREENING MAMMOGRAM (R92.8) Impression: CSL   Your recent imaging studies and biopsy showed a condition called complex sclerosing lesion in the right breast, upper outer quadrant You probably d0 not have cancer However, there is somewhere between a 4 and 9% chance that you might have early cancer right now Excision of this  area is recommended and you state that you would like to go ahead with that following our discussion today  you will be scheduled for right breast  lumpectomy with radioactive seed localization I have discussed the indications, techniques, and risks of this surgery in detail with you and your husband  CORONARY ARTERY DISEASE (I25.10) HYPERLIPIDEMIA, ACQUIRED (E78.5) HYPERTENSION, BENIGN (I10)   Emilyn Ruble M. Derrell Lolling, M.D., [facility] General and Minimally invasive Surgery Breast and Colorectal Surgery Office:   [phone] Pager:   [phone]

## 2017-07-20 NOTE — Pre-Procedure Instructions (Signed)
Krista Contreras  07/20/2017     No Pharmacies Listed   Your procedure is scheduled on Jul 24, 2017.  Report to [facility] Admitting at 1000 AM.  Call this number if you have problems the morning of surgery:  [phone]   Remember:  No food or drink after midnight Jul 23, 2017.    Continue all medications as directed by your physician except follow these medication instructions before surgery below  Take these medicines the morning of surgery with A SIP OF WATER  Propranolol (inderal)  Follow your surgeon's instructions on taking aspirin.  Beginning now,STOP taking any Aleve, Naproxen, Ibuprofen, Motrin, Advil, Goody's, BC's, all herbal medications, fish oil, and all vitamins    Do not wear jewelry, make-up or nail polish.  Do not wear lotions, powders, or perfumes, or deodorant.  Do not shave 48 hours prior to surgery.    Do not bring valuables to the hospital.  [facility] is not responsible for any belongings or valuables.  Contacts, dentures or bridgework may not be worn into surgery.  Leave your suitcase in the car.  After surgery it may be brought to your room.  For patients admitted to the hospital, discharge time will be determined by your treatment team.  Patients discharged the day of surgery will not be allowed to drive home.    [place]- Preparing For Surgery  Before surgery, you can play an important role. Because skin is not sterile, your skin needs to be as free of germs as possible. You can reduce the number of germs on your skin by washing with CHG (chlorahexidine gluconate) Soap before surgery.  CHG is an antiseptic cleaner which kills germs and bonds with the skin to continue killing germs even after washing.    Oral Hygiene is also important to reduce your risk of infection.  Remember - BRUSH YOUR TEETH THE MORNING OF SURGERY WITH YOUR TOOTHPASTE  Please do not use if you have an allergy to CHG or antibacterial soaps. If your skin  becomes reddened/irritated stop using the CHG.  Do not shave (including legs and underarms) for at least 48 hours prior to first CHG shower. It is OK to shave your face.  Please follow these instructions carefully.   1. Shower the NIGHT BEFORE SURGERY and the MORNING OF SURGERY with CHG.   2. If you chose to wash your hair, wash your hair first as usual with your normal shampoo.  3. After you shampoo, rinse your hair and body thoroughly to remove the shampoo.  4. Use CHG as you would any other liquid soap. You can apply CHG directly to the skin and wash gently with a scrungie or a clean washcloth.   5. Apply the CHG Soap to your body ONLY FROM THE NECK DOWN.  Do not use on open wounds or open sores. Avoid contact with your eyes, ears, mouth and genitals (private parts). Wash Face and genitals (private parts)  with your normal soap.  6. Wash thoroughly, paying special attention to the area where your surgery will be performed.  7. Thoroughly rinse your body with warm water from the neck down.  8. DO NOT shower/wash with your normal soap after using and rinsing off the CHG Soap.  9. Pat yourself dry with a CLEAN TOWEL.  10. Wear CLEAN PAJAMAS to bed the night before surgery, wear comfortable clothes the morning of surgery  11. Place CLEAN SHEETS on your bed the night of your  first shower and DO NOT SLEEP WITH PETS.   Day of Surgery:  Do not apply any deodorants/lotions.  Please wear clean clothes to the hospital/surgery center.   Remember to brush your teeth WITH YOUR TOOTHPASTE   Please read over the following fact sheets that you were given. Pain Booklet, Coughing and Deep Breathing and Surgical Site Infection Prevention

## 2017-07-21 ENCOUNTER — Encounter (HOSPITAL_COMMUNITY): Payer: Self-pay

## 2017-07-21 ENCOUNTER — Encounter (HOSPITAL_COMMUNITY)
Admission: RE | Admit: 2017-07-21 | Discharge: 2017-07-21 | Disposition: A | Discharge status: Home / Self Care | Payer: Medicare Other | Source: Ambulatory Visit | Attending: General Surgery | Admitting: General Surgery

## 2017-07-21 DIAGNOSIS — I10 Essential (primary) hypertension: Secondary | ICD-10-CM | POA: Diagnosis not present

## 2017-07-21 DIAGNOSIS — E785 Hyperlipidemia, unspecified: Secondary | ICD-10-CM | POA: Diagnosis not present

## 2017-07-21 DIAGNOSIS — E7800 Pure hypercholesterolemia, unspecified: Secondary | ICD-10-CM | POA: Diagnosis not present

## 2017-07-21 DIAGNOSIS — Z7982 Long term (current) use of aspirin: Secondary | ICD-10-CM | POA: Diagnosis not present

## 2017-07-21 DIAGNOSIS — F419 Anxiety disorder, unspecified: Secondary | ICD-10-CM | POA: Diagnosis not present

## 2017-07-21 DIAGNOSIS — F329 Major depressive disorder, single episode, unspecified: Secondary | ICD-10-CM | POA: Diagnosis not present

## 2017-07-21 DIAGNOSIS — N6489 Other specified disorders of breast: Secondary | ICD-10-CM | POA: Diagnosis present

## 2017-07-21 DIAGNOSIS — Z803 Family history of malignant neoplasm of breast: Secondary | ICD-10-CM | POA: Diagnosis not present

## 2017-07-21 DIAGNOSIS — Z79899 Other long term (current) drug therapy: Secondary | ICD-10-CM | POA: Diagnosis not present

## 2017-07-21 DIAGNOSIS — Z87891 Personal history of nicotine dependence: Secondary | ICD-10-CM | POA: Diagnosis not present

## 2017-07-21 DIAGNOSIS — I2510 Atherosclerotic heart disease of native coronary artery without angina pectoris: Secondary | ICD-10-CM | POA: Diagnosis not present

## 2017-07-21 HISTORY — DX: Depression, unspecified: F32.A

## 2017-07-21 HISTORY — DX: Gastro-esophageal reflux disease without esophagitis: K21.9

## 2017-07-21 HISTORY — DX: Major depressive disorder, single episode, unspecified: F32.9

## 2017-07-21 HISTORY — DX: Essential (primary) hypertension: I10

## 2017-07-21 HISTORY — DX: Unspecified osteoarthritis, unspecified site: M19.90

## 2017-07-21 HISTORY — DX: Anxiety disorder, unspecified: F41.9

## 2017-07-21 LAB — CBC WITH DIFFERENTIAL/PLATELET
Abs Immature Granulocytes: 0 10*3/uL (ref 0.0–0.1)
Basophils Absolute: 0 10*3/uL (ref 0.0–0.1)
Basophils Relative: 1 %
Eosinophils Absolute: 0.1 10*3/uL (ref 0.0–0.7)
Eosinophils Relative: 2 %
HCT: 41 % (ref 36.0–46.0)
Hemoglobin: 13.3 g/dL (ref 12.0–15.0)
Immature Granulocytes: 1 %
Lymphocytes Relative: 25 %
Lymphs Abs: 1.6 10*3/uL (ref 0.7–4.0)
MCH: 28.8 pg (ref 26.0–34.0)
MCHC: 32.4 g/dL (ref 30.0–36.0)
MCV: 88.7 fL (ref 78.0–100.0)
Monocytes Absolute: 0.5 10*3/uL (ref 0.1–1.0)
Monocytes Relative: 7 %
Neutro Abs: 4.1 10*3/uL (ref 1.7–7.7)
Neutrophils Relative %: 64 %
Platelets: 267 10*3/uL (ref 150–400)
RBC: 4.62 MIL/uL (ref 3.87–5.11)
RDW: 12.1 % (ref 11.5–15.5)
WBC: 6.4 10*3/uL (ref 4.0–10.5)

## 2017-07-21 LAB — COMPREHENSIVE METABOLIC PANEL
ALBUMIN: 4.2 g/dL (ref 3.5–5.0)
ALT: 24 U/L (ref 14–54)
ANION GAP: 8 (ref 5–15)
AST: 21 U/L (ref 15–41)
Alkaline Phosphatase: 82 U/L (ref 38–126)
BILIRUBIN TOTAL: 0.7 mg/dL (ref 0.3–1.2)
BUN: 7 mg/dL (ref 6–20)
CO2: 27 mmol/L (ref 22–32)
Calcium: 9.2 mg/dL (ref 8.9–10.3)
Chloride: 108 mmol/L (ref 101–111)
Creatinine, Ser: 0.8 mg/dL (ref 0.44–1.00)
GLUCOSE: 143 mg/dL — AB (ref 65–99)
POTASSIUM: 4.3 mmol/L (ref 3.5–5.1)
Sodium: 143 mmol/L (ref 135–145)
TOTAL PROTEIN: 6.9 g/dL (ref 6.5–8.1)

## 2017-07-21 NOTE — Pre-Procedure Instructions (Addendum)
Krista Contreras  07/21/2017     No Pharmacies Listed   Your procedure is scheduled on Jul 24, 2017.  Report to [facility] Admitting at 1000 AM.  Call this number if you have problems the morning of surgery:  [phone]   Remember:  No food or drink after midnight Jul 23, 2017.  Drink the bottle of Ensure  You were given at your pre-surgical visit just before leaving to come to the hospital the day of surgery    Continue all medications as directed by your physician except follow these medication instructions before surgery below  Take these medicines the morning of surgery with A SIP OF WATER  Propranolol (inderal) Sertraline(Zoloft)  Follow your surgeon's instructions on taking aspirin.  Beginning now,STOP taking any Aleve, Naproxen, Ibuprofen, Motrin, Advil, Goody's, BC's, all herbal medications, fish oil, and all vitamins    Do not wear jewelry, make-up or nail polish.  Do not wear lotions, powders, or perfumes, or deodorant.  Do not shave 48 hours prior to surgery.    Do not bring valuables to the hospital.  [facility] is not responsible for any belongings or valuables.  Contacts, dentures or bridgework may not be worn into surgery.  Leave your suitcase in the car.  After surgery it may be brought to your room.  For patients admitted to the hospital, discharge time will be determined by your treatment team.  Patients discharged the day of surgery will not be allowed to drive home.    [place]- Preparing For Surgery  Before surgery, you can play an important role. Because skin is not sterile, your skin needs to be as free of germs as possible. You can reduce the number of germs on your skin by washing with CHG (chlorahexidine gluconate) Soap before surgery.  CHG is an antiseptic cleaner which kills germs and bonds with the skin to continue killing germs even after washing.    Oral Hygiene is also important to reduce your risk of infection.  Remember -  BRUSH YOUR TEETH THE MORNING OF SURGERY WITH YOUR TOOTHPASTE  Please do not use if you have an allergy to CHG or antibacterial soaps. If your skin becomes reddened/irritated stop using the CHG.  Do not shave (including legs and underarms) for at least 48 hours prior to first CHG shower. It is OK to shave your face.  Please follow these instructions carefully.   1. Shower the NIGHT BEFORE SURGERY and the MORNING OF SURGERY with CHG.   2. If you chose to wash your hair, wash your hair first as usual with your normal shampoo.  3. After you shampoo, rinse your hair and body thoroughly to remove the shampoo.  4. Use CHG as you would any other liquid soap. You can apply CHG directly to the skin and wash gently with a scrungie or a clean washcloth.   5. Apply the CHG Soap to your body ONLY FROM THE NECK DOWN.  Do not use on open wounds or open sores. Avoid contact with your eyes, ears, mouth and genitals (private parts). Wash Face and genitals (private parts)  with your normal soap.  6. Wash thoroughly, paying special attention to the area where your surgery will be performed.  7. Thoroughly rinse your body with warm water from the neck down.  8. DO NOT shower/wash with your normal soap after using and rinsing off the CHG Soap.  9. Pat yourself dry with a CLEAN TOWEL.  10. Wear  CLEAN PAJAMAS to bed the night before surgery, wear comfortable clothes the morning of surgery  11. Place CLEAN SHEETS on your bed the night of your first shower and DO NOT SLEEP WITH PETS.   Day of Surgery:  Do not apply any deodorants/lotions.  Please wear clean clothes to the hospital/surgery center.   Remember to brush your teeth WITH YOUR TOOTHPASTE   Please read over the following fact sheets that you were given. Pain Booklet, Coughing and Deep Breathing and Surgical Site Infection Prevention

## 2017-07-21 NOTE — Progress Notes (Addendum)
PCP  Juluis Rainier MD  Cardiologist Viann Fish MD last  Ov 11-07-2014 Coronary CT done 10-27-2014  Requested most recent visit with Dr. Aprile Aho [phone]

## 2017-07-23 NOTE — H&P (Signed)
History of Present Illness  The patient is a 72 year old female who presents with a breast mass. This is a 72 year old female, referred by Dr. Latricia Heft at [facility] imaging for evaluation of complex sclerosing lesion right breast upper outer quadrant. Juluis Rainier is her PCP. Daine Gravel is her cardiologist.  She gets yearly screening mammograms. She had a radial scar excised in the left breast 15-20 years ago but she does not remember the name of the surgeon. She is asymptomatic. Recent imaging studies showed a distorted area in the upper outer quadrant of the right breast. This was biopsied and shows complex sclerosing lesion. Excision was recommended. She would like this area excised.  Past history significant for coronary artery disease. She's never had a cath but she did have a treadmill. She has hypertension on 3 medications. No chest pain or shortness of breath. Hyperlipidemia. Family history reveals breast cancer in a paternal aunt in her 37s. No other breast, ovarian cancer. A maternal cousin had colon cancer. Mother died of heart disease. Father died at 54 of pneumonia. He had fallen and suffered a stroke secondary to the fall. Social history reveals she was remarried one year ago. Her husband is with her throughout the encounter. They live in [place]. She has 2 children by her first marriage and he has 2 children by his first marriage. She is retired. Denies tobacco or alcohol.  She'll be scheduled for right breast lumpectomy with radioactive seed localization. I discussed indications, details, techniques, and risk of the surgery with her and her husband in detail. She is aware the risks of bleeding, infection, cosmetic deformity, nerve damage , chronic pain, reoperative surgery if cancer, and other unforeseen problems. She understands all these issues. All of her questions are answered. She agrees with this plan.      Past Surgical History  Breast Biopsy   Bilateral. multiple Cataract Surgery  Right. Colon Polyp Removal - Colonoscopy   Diagnostic Studies History  Colonoscopy  1-5 years ago Mammogram  within last year  Allergies  No Known Drug Allergies  Allergies Reconciled   Medication History  Atorvastatin Calcium (  Tablet, Oral) Active. Propranolol HCl ER (  Capsule ER 24HR, Oral) Active. Ramipril (  Capsule, Oral) Active. Sertraline HCl (  Tablet, Oral) Active. Vitamin D (2000UNIT Tablet, Oral) Active. Aspirin (  Tablet, Oral) Active. Medications Reconciled  Social History No alcohol use  No caffeine use  No drug use  Tobacco use  Never smoker.  Family History  Alcohol Abuse  Family Members In General. Breast Cancer  Family Members In General. Heart Disease  Mother. Hypertension  Father.  Pregnancy / Birth History  Age at menarche  14 years. Age of menopause  <47 Contraceptive History  Oral contraceptives. Gravida  2 Maternal age  31-25 Para  2  Other Problems  Anxiety Disorder  Gastroesophageal Reflux Disease  High blood pressure  Hypercholesterolemia     Review of Systems  General Not Present- Appetite Loss, Chills, Fatigue, Fever, Night Sweats, Weight Gain and Weight Loss. Skin Not Present- Change in Wart/Mole, Dryness, Hives, Jaundice, New Lesions, Non-Healing Wounds, Rash and Ulcer. HEENT Present- Wears glasses/contact lenses. Not Present- Earache, Hearing Loss, Hoarseness, Nose Bleed, Oral Ulcers, Ringing in the Ears, Seasonal Allergies, Sinus Pain, Sore Throat, Visual Disturbances and Yellow Eyes. Respiratory Not Present- Bloody sputum, Chronic Cough, Difficulty Breathing, Snoring and Wheezing. Breast Not Present- Breast Mass, Breast Pain, Nipple Discharge and Skin Changes. Cardiovascular Present- Palpitations. Not Present- Chest Pain, Difficulty Breathing Lying Down, Leg  Cramps, Rapid Heart Rate, Shortness of Breath and Swelling of  Extremities. Gastrointestinal Not Present- Abdominal Pain, Bloating, Bloody Stool, Change in Bowel Habits, Chronic diarrhea, Constipation, Difficulty Swallowing, Excessive gas, Gets full quickly at meals, Hemorrhoids, Indigestion, Nausea, Rectal Pain and Vomiting. Female Genitourinary Not Present- Frequency, Nocturia, Painful Urination, Pelvic Pain and Urgency. Musculoskeletal Not Present- Back Pain, Joint Pain, Joint Stiffness, Muscle Pain, Muscle Weakness and Swelling of Extremities. Neurological Not Present- Decreased Memory, Fainting, Headaches, Numbness, Seizures, Tingling, Tremor, Trouble walking and Weakness. Psychiatric Present- Anxiety. Not Present- Bipolar, Change in Sleep Pattern, Depression, Fearful and Frequent crying. Endocrine Not Present- Cold Intolerance, Excessive Hunger, Hair Changes, Heat Intolerance, Hot flashes and New Diabetes. Hematology Present- Blood Thinners. Not Present- Easy Bruising, Excessive bleeding, Gland problems, HIV and Persistent Infections.  Vitals   Weight: 145.2 lb Height: 60in Body Surface Area: 1.63 m Body Mass Index: 28.36 kg/m  Temp.: 98.11F  Pulse: 72 (Regular)  BP: 132/84 (Sitting, Left Arm, Standard)       Physical Exam ( General Mental Status-Alert. General Appearance-Consistent with stated age. Hydration-Well hydrated. Voice-Normal.  Head and Neck Head-normocephalic, atraumatic with no lesions or palpable masses. Trachea-midline. Thyroid Gland Characteristics - normal size and consistency.  Eye Eyeball - Bilateral-Extraocular movements intact. Sclera/Conjunctiva - Bilateral-No scleral icterus.  Chest and Lung Exam Chest and lung exam reveals -quiet, even and easy respiratory effort with no use of accessory muscles and on auscultation, normal breath sounds, no adventitious sounds and normal vocal resonance. Inspection Chest Wall - Normal. Back - normal.  Breast Note: Breasts are medium to  large in size. Ecchymoses lateral right breast. Otherwise no skin change mass or axillary adenopathy on either side. There is a circumareolar scar medial left breast well healed. Nipple and areola complexes looked normal   Cardiovascular Cardiovascular examination reveals -normal heart sounds, regular rate and rhythm with no murmurs and normal pedal pulses bilaterally.  Abdomen Inspection Inspection of the abdomen reveals - No Hernias. Skin - Scar - no surgical scars. Palpation/Percussion Palpation and Percussion of the abdomen reveal - Soft, Non Tender, No Rebound tenderness, No Rigidity (guarding) and No hepatosplenomegaly. Auscultation Auscultation of the abdomen reveals - Bowel sounds normal.  Neurologic Neurologic evaluation reveals -alert and oriented x 3 with no impairment of recent or remote memory. Mental Status-Normal.  Musculoskeletal Normal Exam - Left-Upper Extremity Strength Normal and Lower Extremity Strength Normal. Normal Exam - Right-Upper Extremity Strength Normal and Lower Extremity Strength Normal.  Lymphatic Head & Neck  General Head & Neck Lymphatics: Bilateral - Description - Normal. Axillary  General Axillary Region: Bilateral - Description - Normal. Tenderness - Non Tender. Femoral & Inguinal  Generalized Femoral & Inguinal Lymphatics: Bilateral - Description - Normal. Tenderness - Non Tender.    Assessment &  ABNORMALITY OF RIGHT BREAST ON SCREENING MAMMOGRAM (R92.8) Impression: CSL Current Plans Pt Education - [facility] Breast Biopsy HCI: discussed with patient and provided information. Pt Education - Pamphlet Given - Breast Biopsy: discussed with patient and provided information.    She will be scheduled for right breast lumpectomy with radioactive seed localization

## 2017-07-24 ENCOUNTER — Ambulatory Visit (HOSPITAL_COMMUNITY): Payer: Medicare Other | Admitting: Anesthesiology

## 2017-07-24 ENCOUNTER — Encounter (HOSPITAL_COMMUNITY)
Admission: RE | Disposition: A | Discharge status: Home / Self Care | Payer: Self-pay | Source: Ambulatory Visit | Attending: General Surgery

## 2017-07-24 ENCOUNTER — Encounter (HOSPITAL_COMMUNITY): Payer: Self-pay

## 2017-07-24 ENCOUNTER — Ambulatory Visit (HOSPITAL_COMMUNITY)
Admission: RE | Admit: 2017-07-24 | Discharge: 2017-07-24 | Disposition: A | Discharge status: Home / Self Care | Payer: Medicare Other | Source: Ambulatory Visit | Attending: General Surgery | Admitting: General Surgery

## 2017-07-24 DIAGNOSIS — Z87891 Personal history of nicotine dependence: Secondary | ICD-10-CM | POA: Insufficient documentation

## 2017-07-24 DIAGNOSIS — E785 Hyperlipidemia, unspecified: Secondary | ICD-10-CM | POA: Insufficient documentation

## 2017-07-24 DIAGNOSIS — I2510 Atherosclerotic heart disease of native coronary artery without angina pectoris: Secondary | ICD-10-CM | POA: Insufficient documentation

## 2017-07-24 DIAGNOSIS — N6489 Other specified disorders of breast: Secondary | ICD-10-CM | POA: Insufficient documentation

## 2017-07-24 DIAGNOSIS — Z803 Family history of malignant neoplasm of breast: Secondary | ICD-10-CM | POA: Insufficient documentation

## 2017-07-24 DIAGNOSIS — Z79899 Other long term (current) drug therapy: Secondary | ICD-10-CM | POA: Insufficient documentation

## 2017-07-24 DIAGNOSIS — F329 Major depressive disorder, single episode, unspecified: Secondary | ICD-10-CM | POA: Insufficient documentation

## 2017-07-24 DIAGNOSIS — E7800 Pure hypercholesterolemia, unspecified: Secondary | ICD-10-CM | POA: Insufficient documentation

## 2017-07-24 DIAGNOSIS — I10 Essential (primary) hypertension: Secondary | ICD-10-CM | POA: Diagnosis not present

## 2017-07-24 DIAGNOSIS — Z7982 Long term (current) use of aspirin: Secondary | ICD-10-CM | POA: Insufficient documentation

## 2017-07-24 DIAGNOSIS — F419 Anxiety disorder, unspecified: Secondary | ICD-10-CM | POA: Insufficient documentation

## 2017-07-24 HISTORY — PX: BREAST LUMPECTOMY WITH RADIOACTIVE SEED LOCALIZATION: SHX6424

## 2017-07-24 SURGERY — BREAST LUMPECTOMY WITH RADIOACTIVE SEED LOCALIZATION
Anesthesia: General | Site: Breast | Laterality: Right

## 2017-07-24 MED ORDER — LIDOCAINE HCL (CARDIAC) PF 100 MG/5ML IV SOSY
PREFILLED_SYRINGE | INTRAVENOUS | Status: DC | PRN
  Administered 2017-07-24: 80 mg via INTRAVENOUS

## 2017-07-24 MED ORDER — CELECOXIB 200 MG PO CAPS
ORAL_CAPSULE | ORAL | Status: AC
  Administered 2017-07-24: 200 mg via ORAL
  Filled 2017-07-24: qty 1

## 2017-07-24 MED ORDER — BUPIVACAINE-EPINEPHRINE 0.25% -1:200000 IJ SOLN
INTRAMUSCULAR | Status: DC | PRN
  Administered 2017-07-24: 20 mL

## 2017-07-24 MED ORDER — CHLORHEXIDINE GLUCONATE CLOTH 2 % EX PADS: 6.0000 | MEDICATED_PAD | Freq: Once | CUTANEOUS | Status: DC

## 2017-07-24 MED ORDER — CEFAZOLIN SODIUM-DEXTROSE 2-4 GM/100ML-% IV SOLN
2.0000 g | INTRAVENOUS | Status: AC
  Administered 2017-07-24: 2 g via INTRAVENOUS

## 2017-07-24 MED ORDER — DEXAMETHASONE SODIUM PHOSPHATE 10 MG/ML IJ SOLN
INTRAMUSCULAR | Status: DC | PRN
  Administered 2017-07-24: 8 mg via INTRAVENOUS

## 2017-07-24 MED ORDER — CEFAZOLIN SODIUM-DEXTROSE 2-4 GM/100ML-% IV SOLN
INTRAVENOUS | Status: AC
  Filled 2017-07-24: qty 100

## 2017-07-24 MED ORDER — CELECOXIB 200 MG PO CAPS
200.0000 mg | ORAL_CAPSULE | ORAL | Status: AC
  Administered 2017-07-24: 200 mg via ORAL

## 2017-07-24 MED ORDER — LACTATED RINGERS IV SOLN
INTRAVENOUS | Status: DC
  Administered 2017-07-24: 15:00:00 via INTRAVENOUS

## 2017-07-24 MED ORDER — MIDAZOLAM HCL 2 MG/2ML IJ SOLN
INTRAMUSCULAR | Status: DC | PRN
  Administered 2017-07-24: 2 mg via INTRAVENOUS

## 2017-07-24 MED ORDER — FENTANYL CITRATE (PF) 100 MCG/2ML IJ SOLN: 25.0000 ug | INTRAMUSCULAR | Status: DC | PRN

## 2017-07-24 MED ORDER — 0.9 % SODIUM CHLORIDE (POUR BTL) OPTIME
TOPICAL | Status: DC | PRN
  Administered 2017-07-24: 1000 mL

## 2017-07-24 MED ORDER — TRAMADOL HCL 50 MG PO TABS: 50.0000 mg | ORAL_TABLET | Freq: Four times a day (QID) | ORAL | 1 refills | Status: DC | PRN

## 2017-07-24 MED ORDER — GABAPENTIN 300 MG PO CAPS
ORAL_CAPSULE | ORAL | Status: AC
Start: 2017-07-24 — End: 2017-07-24
  Administered 2017-07-24: 300 mg via ORAL
  Filled 2017-07-24: qty 1

## 2017-07-24 MED ORDER — ONDANSETRON HCL 4 MG/2ML IJ SOLN
INTRAMUSCULAR | Status: DC | PRN
  Administered 2017-07-24: 4 mg via INTRAVENOUS

## 2017-07-24 MED ORDER — ACETAMINOPHEN 500 MG PO TABS
1000.0000 mg | ORAL_TABLET | ORAL | Status: AC
  Administered 2017-07-24: 1000 mg via ORAL

## 2017-07-24 MED ORDER — ACETAMINOPHEN 500 MG PO TABS
ORAL_TABLET | ORAL | Status: AC
  Administered 2017-07-24: 1000 mg via ORAL
  Filled 2017-07-24: qty 2

## 2017-07-24 MED ORDER — BUPIVACAINE-EPINEPHRINE (PF) 0.25% -1:200000 IJ SOLN
INTRAMUSCULAR | Status: AC
  Filled 2017-07-24: qty 30

## 2017-07-24 MED ORDER — PHENYLEPHRINE HCL 10 MG/ML IJ SOLN
INTRAMUSCULAR | Status: DC | PRN
  Administered 2017-07-24: 120 ug via INTRAVENOUS

## 2017-07-24 MED ORDER — FENTANYL CITRATE (PF) 100 MCG/2ML IJ SOLN
INTRAMUSCULAR | Status: DC | PRN
  Administered 2017-07-24: 50 ug via INTRAVENOUS

## 2017-07-24 MED ORDER — PROPOFOL 10 MG/ML IV BOLUS
INTRAVENOUS | Status: DC | PRN
  Administered 2017-07-24: 140 mg via INTRAVENOUS

## 2017-07-24 MED ORDER — GLYCOPYRROLATE 0.2 MG/ML IJ SOLN
INTRAMUSCULAR | Status: DC | PRN
  Administered 2017-07-24: 0.2 mg via INTRAVENOUS

## 2017-07-24 MED ORDER — GABAPENTIN 300 MG PO CAPS
300.0000 mg | ORAL_CAPSULE | ORAL | Status: AC
  Administered 2017-07-24: 300 mg via ORAL

## 2017-07-24 SURGICAL SUPPLY — 43 items
BINDER BREAST LRG (GAUZE/BANDAGES/DRESSINGS) IMPLANT
BINDER BREAST XLRG (GAUZE/BANDAGES/DRESSINGS) ×2 IMPLANT
BLADE SURG 15 STRL LF DISP TIS (BLADE) ×1 IMPLANT
BLADE SURG 15 STRL SS (BLADE) ×2
CANISTER SUCT 3000ML PPV (MISCELLANEOUS) ×3 IMPLANT
CHLORAPREP W/TINT 26ML (MISCELLANEOUS) ×3 IMPLANT
CLIP VESOCCLUDE SM WIDE 6/CT (CLIP) ×3 IMPLANT
COVER PROBE W GEL 5X96 (DRAPES) ×3 IMPLANT
COVER SURGICAL LIGHT HANDLE (MISCELLANEOUS) ×3 IMPLANT
DERMABOND ADVANCED (GAUZE/BANDAGES/DRESSINGS) ×2
DERMABOND ADVANCED .7 DNX12 (GAUZE/BANDAGES/DRESSINGS) ×1 IMPLANT
DEVICE DUBIN SPECIMEN MAMMOGRA (MISCELLANEOUS) ×3 IMPLANT
DRAPE CHEST BREAST 15X10 FENES (DRAPES) ×3 IMPLANT
DRAPE UTILITY XL STRL (DRAPES) ×3 IMPLANT
DRSG PAD ABDOMINAL 8X10 ST (GAUZE/BANDAGES/DRESSINGS) ×3 IMPLANT
ELECT COATED BLADE 2.86 ST (ELECTRODE) ×5 IMPLANT
ELECT REM PT RETURN 9FT ADLT (ELECTROSURGICAL) ×3
ELECTRODE REM PT RTRN 9FT ADLT (ELECTROSURGICAL) ×1 IMPLANT
GAUZE SPONGE 4X4 16PLY XRAY LF (GAUZE/BANDAGES/DRESSINGS) ×2 IMPLANT
GLOVE BIOGEL PI IND STRL 8 (GLOVE) ×1 IMPLANT
GLOVE BIOGEL PI INDICATOR 8 (GLOVE) ×2
GLOVE ECLIPSE 7.5 STRL STRAW (GLOVE) ×3 IMPLANT
GOWN STRL REUS W/ TWL LRG LVL3 (GOWN DISPOSABLE) ×1 IMPLANT
GOWN STRL REUS W/ TWL XL LVL3 (GOWN DISPOSABLE) ×1 IMPLANT
GOWN STRL REUS W/TWL LRG LVL3 (GOWN DISPOSABLE) ×2
GOWN STRL REUS W/TWL XL LVL3 (GOWN DISPOSABLE) ×2
KIT BASIN OR (CUSTOM PROCEDURE TRAY) ×3 IMPLANT
KIT MARKER MARGIN INK (KITS) ×3 IMPLANT
NDL HYPO 25GX1X1/2 BEV (NEEDLE) ×1 IMPLANT
NEEDLE HYPO 25GX1X1/2 BEV (NEEDLE) ×3 IMPLANT
NS IRRIG 1000ML POUR BTL (IV SOLUTION) ×3 IMPLANT
PACK SURGICAL SETUP 50X90 (CUSTOM PROCEDURE TRAY) ×3 IMPLANT
PENCIL BUTTON HOLSTER BLD 10FT (ELECTRODE) ×3 IMPLANT
SPONGE LAP 18X18 X RAY DECT (DISPOSABLE) ×3 IMPLANT
SUT MON AB 5-0 PS2 18 (SUTURE) ×3 IMPLANT
SUT VIC AB 3-0 SH 18 (SUTURE) ×3 IMPLANT
SYR BULB 3OZ (MISCELLANEOUS) ×3 IMPLANT
SYR CONTROL 10ML LL (SYRINGE) ×3 IMPLANT
TOWEL OR 17X24 6PK STRL BLUE (TOWEL DISPOSABLE) ×3 IMPLANT
TOWEL OR 17X26 10 PK STRL BLUE (TOWEL DISPOSABLE) ×3 IMPLANT
TUBE CONNECTING 12'X1/4 (SUCTIONS) ×1
TUBE CONNECTING 12X1/4 (SUCTIONS) ×2 IMPLANT
YANKAUER SUCT BULB TIP NO VENT (SUCTIONS) ×3 IMPLANT

## 2017-07-24 NOTE — Op Note (Signed)
Preoperative Diagnosis: COMPLEX SCLEROSING LESION  Postoprative Diagnosis: COMPLEX SCLEROSING LESION  Procedure: Procedure(s): BREAST LUMPECTOMY WITH RADIOACTIVE SEED LOCALIZATION   Surgeon: Glenna Fellows T   Assistants: None  Anesthesia:  General LMA anesthesia  Indications: Patient on recent screening mammogram was found to have an abnormal density in the upper outer right breast and large core needle biopsy was performed showing a complex sclerosing lesion.  After consultation and discussion regarding options, nature of surgery and risks she has elected to proceed with radioactive seed localized lumpectomy to rule out underlying malignancy.    Procedure Detail: Patient had previously undergone accurate placement of radioactive seed at the marking clip and density site.  She was taken to the operating room, placed in the supine position on the operating table and laryngeal mask general anesthesia induced.  The right breast was widely sterilely prepped and draped.  She received preoperative IV antibiotics.  PAS were in place.  Patient timeout was performed and correct procedure verified.  The seed location was confirmed and marked with the neoprobe.  I used a curvilinear circumareolar incision in the upper outer breast and a short skin and subcutaneous flap was raised superiorly and laterally overlying the seed confirmed with the neoprobe.  Using the probe for guidance I then excised a globular specimen of breast tissue about 1-1/2 to 2 cm in diameter around the seed.  Breast tissue was somewhat dense but no mass lesion.  The specimen was removed and margins inked.  Specimen x-ray showed the seton marking clip within the specimen, somewhat closer to the medial margin.  Due to likely benign pathology I did not take any further tissue in this direction.  Hemostasis was assured and soft tissue was infiltrated with Marcaine.  The lumpectomy cavity was marked with clips.  Deep breast and  subcutaneous tissue was closed with interrupted 4-0 Vicryl and the skin closed with a running subcuticular 5-0 Monocryl.  Dermabond applied.  Sponge needle and instrument counts were correct.    Findings: As above  Estimated Blood Loss:  Minimal         Drains: None  Blood Given: none          Specimens: Right breast tissue margins inked and oriented        Complications:  * No complications entered in OR log *         Disposition: PACU - hemodynamically stable.         Condition: stable

## 2017-07-24 NOTE — Anesthesia Postprocedure Evaluation (Signed)
Anesthesia Post Note  Patient: Krista Contreras  Procedure(s) Performed: BREAST LUMPECTOMY WITH RADIOACTIVE SEED LOCALIZATION (Right Breast)     Patient location during evaluation: PACU Anesthesia Type: General Level of consciousness: awake and alert Pain management: pain level controlled Vital Signs Assessment: post-procedure vital signs reviewed and stable Respiratory status: spontaneous breathing, nonlabored ventilation, respiratory function stable and patient connected to nasal cannula oxygen Cardiovascular status: blood pressure returned to baseline and stable Postop Assessment: no apparent nausea or vomiting Anesthetic complications: no    Last Vitals:  Vitals:   07/24/17 1724 07/24/17 1735  BP:  (!) 146/75  Pulse:  79  Resp:  10  Temp: 36.5 C 36.5 C  SpO2:  91%    Last Pain:  Vitals:   07/24/17 1720  TempSrc:   PainSc: 0-No pain                 Geneveive Furness COKER

## 2017-07-24 NOTE — Anesthesia Procedure Notes (Signed)
Procedure Name: LMA Insertion Date/Time: 07/24/2017 4:25 PM Performed by: Epifanio Lesches, CRNA Pre-anesthesia Checklist: Patient identified, Emergency Drugs available, Suction available and Patient being monitored Patient Re-evaluated:Patient Re-evaluated prior to induction Oxygen Delivery Method: Circle System Utilized Preoxygenation: Pre-oxygenation with 100% oxygen Induction Type: IV induction Ventilation: Mask ventilation without difficulty LMA: LMA inserted LMA Size: 4.0 Number of attempts: 1 Airway Equipment and Method: Bite block Placement Confirmation: positive ETCO2 and breath sounds checked- equal and bilateral Tube secured with: Tape Dental Injury: Teeth and Oropharynx as per pre-operative assessment

## 2017-07-24 NOTE — Discharge Instructions (Signed)
[facility] °Office Phone Number [phone] ° °BREAST BIOPSY/ PARTIAL MASTECTOMY: POST OP INSTRUCTIONS ° °Always review your discharge instruction sheet given to you by the facility where your surgery was performed. ° °IF YOU HAVE DISABILITY OR FAMILY LEAVE FORMS, YOU MUST BRING THEM TO THE OFFICE FOR PROCESSING.  DO NOT GIVE THEM TO YOUR DOCTOR. ° °1. A prescription for pain medication may be given to you upon discharge.  Take your pain medication as prescribed, if needed.  If narcotic pain medicine is not needed, then you may take acetaminophen (Tylenol) or ibuprofen (Advil) as needed. °2. Take your usually prescribed medications unless otherwise directed °3. If you need a refill on your pain medication, please contact your pharmacy.  They will contact our office to request authorization.  Prescriptions will not be filled after 5pm or on week-ends. °4. You should eat very light the first 24 hours after surgery, such as soup, crackers, pudding, etc.  Resume your normal diet the day after surgery. °5. Most patients will experience some swelling and bruising in the breast.  Ice packs and a good support bra will help.  Swelling and bruising can take several days to resolve.  °6. It is common to experience some constipation if taking pain medication after surgery.  Increasing fluid intake and taking a stool softener will usually help or prevent this problem from occurring.  A mild laxative (Milk of Magnesia or Miralax) should be taken according to package directions if there are no bowel movements after 48 hours. °7. Unless discharge instructions indicate otherwise, you may remove your bandages 24-48 hours after surgery, and you may shower at that time.  You may have steri-strips (small skin tapes) in place directly over the incision.  These strips should be left on the skin for 7-10 days.  If your surgeon used skin glue on the incision, you may shower in 24 hours.  The glue will flake off over the  next 2-3 weeks.  Any sutures or staples will be removed at the office during your follow-up visit. °8. ACTIVITIES:  You may resume regular daily activities (gradually increasing) beginning the next day.  Wearing a good support bra or sports bra minimizes pain and swelling.  You may have sexual intercourse when it is comfortable. °a. You may drive when you no longer are taking prescription pain medication, you can comfortably wear a seatbelt, and you can safely maneuver your car and apply brakes. °b. RETURN TO WORK:  ______________________________________________________________________________________ °9. You should see your doctor in the office for a follow-up appointment approximately two weeks after your surgery.  Your doctor’s nurse will typically make your follow-up appointment when she calls you with your pathology report.  Expect your pathology report 2-3 business days after your surgery.  You may call to check if you do not hear from us after three days. °10. OTHER INSTRUCTIONS: _______________________________________________________________________________________________ _____________________________________________________________________________________________________________________________________ °_____________________________________________________________________________________________________________________________________ °_____________________________________________________________________________________________________________________________________ ° °WHEN TO CALL YOUR DOCTOR: °1. Fever over 101.0 °2. Nausea and/or vomiting. °3. Extreme swelling or bruising. °4. Continued bleeding from incision. °5. Increased pain, redness, or drainage from the incision. ° °The clinic staff is available to answer your questions during regular business hours.  Please don’t hesitate to call and ask to speak to one of the nurses for clinical concerns.  If you have a medical emergency, go to the nearest  emergency room or call 911.  A surgeon from [facility] is always on call at the hospital. ° °For further questions, please visit [email or web address]  ° ° ° ° °  Post Anesthesia Home Care Instructions ° °Activity: °Get plenty of rest for the remainder of the day. A responsible individual must stay with you for 24 hours following the procedure.  °For the next 24 hours, DO NOT: °-Drive a car °-Operate machinery °-Drink alcoholic beverages °-Take any medication unless instructed by your physician °-Make any legal decisions or sign important papers. ° °Meals: °Start with liquid foods such as gelatin or soup. Progress to regular foods as tolerated. Avoid greasy, spicy, heavy foods. If nausea and/or vomiting occur, drink only clear liquids until the nausea and/or vomiting subsides. Call your physician if vomiting continues. ° °Special Instructions/Symptoms: °Your throat may feel dry or sore from the anesthesia or the breathing tube placed in your throat during surgery. If this causes discomfort, gargle with warm salt water. The discomfort should disappear within 24 hours. ° °If you had a scopolamine patch placed behind your ear for the management of post- operative nausea and/or vomiting: ° °1. The medication in the patch is effective for 72 hours, after which it should be removed.  Wrap patch in a tissue and discard in the trash. Wash hands thoroughly with soap and water. °2. You may remove the patch earlier than 72 hours if you experience unpleasant side effects which may include dry mouth, dizziness or visual disturbances. °3. Avoid touching the patch. Wash your hands with soap and water after contact with the patch. °  ° °

## 2017-07-24 NOTE — Anesthesia Preprocedure Evaluation (Addendum)
Anesthesia Evaluation  Patient identified by MRN, date of birth, ID band Patient awake    Reviewed: Allergy & Precautions, H&P , NPO status , Patient's Chart, lab work & pertinent test results, reviewed documented beta blocker date and time   Airway Mallampati: II  TM Distance: >3 FB Neck ROM: Full    Dental no notable dental hx. (+) Teeth Intact, Dental Advisory Given   Pulmonary neg pulmonary ROS, former smoker,    Pulmonary exam normal breath sounds clear to auscultation       Cardiovascular hypertension, Pt. on medications and Pt. on home beta blockers + CAD   Rhythm:Regular Rate:Normal     Neuro/Psych Anxiety Depression negative neurological ROS     GI/Hepatic negative GI ROS, Neg liver ROS, GERD  Controlled,  Endo/Other  negative endocrine ROS  Renal/GU negative Renal ROS  negative genitourinary   Musculoskeletal  (+) Arthritis , Osteoarthritis,    Abdominal   Peds  Hematology negative hematology ROS (+)   Anesthesia Other Findings   Reproductive/Obstetrics negative OB ROS                            Anesthesia Physical Anesthesia Plan  ASA: II  Anesthesia Plan: General   Post-op Pain Management:    Induction: Intravenous  PONV Risk Score and Plan: 4 or greater and Ondansetron, Dexamethasone and Midazolam  Airway Management Planned: LMA  Additional Equipment:   Intra-op Plan:   Post-operative Plan: Extubation in OR  Informed Consent: I have reviewed the patients History and Physical, chart, labs and discussed the procedure including the risks, benefits and alternatives for the proposed anesthesia with the patient or authorized representative who has indicated his/her understanding and acceptance.   Dental advisory given  Plan Discussed with: CRNA  Anesthesia Plan Comments:         Anesthesia Quick Evaluation

## 2017-07-24 NOTE — Transfer of Care (Signed)
Immediate Anesthesia Transfer of Care Note  Patient: Krista Contreras  Procedure(s) Performed: BREAST LUMPECTOMY WITH RADIOACTIVE SEED LOCALIZATION (Right Breast)  Patient Location: PACU  Anesthesia Type:General  Level of Consciousness: awake, alert  and oriented  Airway & Oxygen Therapy: Patient Spontanous Breathing and Patient connected to nasal cannula oxygen  Post-op Assessment: Report given to RN and Post -op Vital signs reviewed and stable  Post vital signs: Reviewed and stable  Last Vitals:  Vitals Value Taken Time  BP 142/67 07/24/2017  5:20 PM  Temp    Pulse 88 07/24/2017  5:23 PM  Resp 15 07/24/2017  5:23 PM  SpO2 89 % 07/24/2017  5:23 PM  Vitals shown include unvalidated device data.  Last Pain:  Vitals:   07/24/17 1443  TempSrc:   PainSc: 0-No pain         Complications: No apparent anesthesia complications

## 2017-07-24 NOTE — Interval H&P Note (Signed)
History and Physical Interval Note:  07/24/2017 3:50 PM  Krista Contreras  has presented today for surgery, with the diagnosis of COMPLEX SCLEROSING LESION  The various methods of treatment have been discussed with the patient and family. After consideration of risks, benefits and other options for treatment, the patient has consented to  Procedure(s): BREAST LUMPECTOMY WITH RADIOACTIVE SEED LOCALIZATION (Right) as a surgical intervention .  The patient's history has been reviewed, patient examined, no change in status, stable for surgery.  I have reviewed the patient's chart and labs.  Questions were answered to the patient's satisfaction.     Lorne Skeens Vincenzo Stave

## 2017-07-25 ENCOUNTER — Encounter (HOSPITAL_COMMUNITY): Payer: Self-pay | Admitting: General Surgery

## 2017-10-19 DIAGNOSIS — H02834 Dermatochalasis of left upper eyelid: Secondary | ICD-10-CM | POA: Insufficient documentation

## 2017-10-19 DIAGNOSIS — H02831 Dermatochalasis of right upper eyelid: Secondary | ICD-10-CM

## 2017-10-19 DIAGNOSIS — H04123 Dry eye syndrome of bilateral lacrimal glands: Secondary | ICD-10-CM

## 2017-10-19 HISTORY — DX: Dermatochalasis of right upper eyelid: H02.831

## 2017-10-19 HISTORY — DX: Dry eye syndrome of bilateral lacrimal glands: H04.123

## 2017-10-19 HISTORY — DX: Dermatochalasis of left upper eyelid: H02.834

## 2017-12-11 ENCOUNTER — Other Ambulatory Visit: Payer: Self-pay | Admitting: Emergency Medicine

## 2017-12-11 MED ORDER — RAMIPRIL 5 MG PO CAPS: 5.0000 mg | ORAL_CAPSULE | Freq: Every day | ORAL | 3 refills | Status: DC

## 2017-12-11 NOTE — Telephone Encounter (Signed)
Dr. Anyia Aho patient. Per Dr. Bing Matter ramipril 5 mg refilled.

## 2017-12-22 DIAGNOSIS — R12 Heartburn: Secondary | ICD-10-CM

## 2017-12-22 DIAGNOSIS — R141 Gas pain: Secondary | ICD-10-CM | POA: Insufficient documentation

## 2017-12-22 DIAGNOSIS — Z6828 Body mass index (BMI) 28.0-28.9, adult: Secondary | ICD-10-CM

## 2017-12-22 HISTORY — DX: Body mass index (BMI) 28.0-28.9, adult: Z68.28

## 2017-12-22 HISTORY — DX: Gas pain: R14.1

## 2017-12-22 HISTORY — DX: Heartburn: R12

## 2018-05-06 DIAGNOSIS — H353131 Nonexudative age-related macular degeneration, bilateral, early dry stage: Secondary | ICD-10-CM | POA: Insufficient documentation

## 2018-05-06 HISTORY — DX: Nonexudative age-related macular degeneration, bilateral, early dry stage: H35.3131

## 2018-06-16 ENCOUNTER — Telehealth: Payer: Self-pay | Admitting: Cardiology

## 2018-06-16 NOTE — Telephone Encounter (Signed)
Virtual Visit Pre-Appointment Phone Call  Steps For Call:  1. Confirm consent - "In the setting of the current Covid19 crisis, you are scheduled for a (phone or video) visit with your provider on (date) at (time).  Just as we do with many in-office visits, in order for you to participate in this visit, we must obtain consent.  If you'd like, I can send this to your mychart (if signed up) or email for you to review.  Otherwise, I can obtain your verbal consent now.  All virtual visits are billed to your insurance company just like a normal visit would be.  By agreeing to a virtual visit, we'd like you to understand that the technology does not allow for your provider to perform an examination, and thus may limit your provider's ability to fully assess your condition.  Finally, though the technology is pretty good, we cannot assure that it will always work on either your or our end, and in the setting of a video visit, we may have to convert it to a phone-only visit.  In either situation, we cannot ensure that we have a secure connection.  Are you willing to proceed?" STAFF: Did the patient verbally acknowledge consent to telehealth visit? Document YES/NO here: yes  2. Confirm the BEST phone number to call the day of the visit by including in appointment notes [phone]  3. Give patient instructions for WebEx/MyChart download to smartphone as below or Doximity/Doxy.me if video visit (depending on what platform provider is using)  4. Advise patient to be prepared with their blood pressure, heart rate, weight, any heart rhythm information, their current medicines, and a piece of paper and pen handy for any instructions they may receive the day of their visit  5. Inform patient they will receive a phone call 15 minutes prior to their appointment time (may be from unknown caller ID) so they should be prepared to answer  6. Confirm that appointment type is correct in Epic appointment notes (VIDEO vs  PHONE)     TELEPHONE CALL NOTE  Krista Contreras has been deemed a candidate for a follow-up tele-health visit to limit community exposure during the Covid-19 pandemic. I spoke with the patient via phone to ensure availability of phone/video source, confirm preferred email & phone number, and discuss instructions and expectations.  I reminded Krista Contreras to be prepared with any vital sign and/or heart rhythm information that could potentially be obtained via home monitoring, at the time of her visit. I reminded Krista Contreras to expect a phone call at the time of her visit if her visit.  Krista Contreras 06/16/2018 12:50 PM   INSTRUCTIONS FOR DOWNLOADING THE WEBEX APP TO SMARTPHONE  - If Apple, ask patient to go to [employer] and type in WebEx in the search bar. Download [employer], the blue/green circle. If Android, go to [employer] and type in [employer] in the search bar. The app is free but as with any other app downloads, their phone may require them to verify saved payment information or Apple/Android password.  - The patient does NOT have to create an account. - On the day of the visit, the assist will walk the patient through joining the meeting with the meeting number/password.  INSTRUCTIONS FOR DOWNLOADING THE MYCHART APP TO SMARTPHONE  - The patient must first make sure to have activated MyChart and know their login information - If Apple, go to [employer] and type in MyChart in  the search bar and download the app. If Android, ask patient to go to [employer] and type in [place] in the search bar and download the app. The app is free but as with any other app downloads, their phone may require them to verify saved payment information or Apple/Android password.  - The patient will need to then log into the app with their MyChart username and password, and select [facility] as their healthcare provider to link the account. When it is time for your visit, go to the  MyChart app, find appointments, and click Begin Video Visit. Be sure to Select Allow for your device to access the Microphone and Camera for your visit. You will then be connected, and your provider will be with you shortly.  **If they have any issues connecting, or need assistance please contact MyChart service desk [phone])**  **If using a computer, in order to ensure the best quality for their visit they will need to use either of the following Internet Browsers: [employer], or Google Chrome**  IF USING DOXIMITY or DOXY.ME - The patient will receive a link just prior to their visit, either by text or email (to be determined day of appointment depending on if it's doxy.me or Doximity).     FULL LENGTH CONSENT FOR TELE-HEALTH VISIT   I hereby voluntarily request, consent and authorize [facility] and its employed or contracted physicians, physician assistants, nurse practitioners or other licensed health care professionals (the Practitioner), to provide me with telemedicine health care services (the Services") as deemed necessary by the treating Practitioner. I acknowledge and consent to receive the Services by the Practitioner via telemedicine. I understand that the telemedicine visit will involve communicating with the Practitioner through live audiovisual communication technology and the disclosure of certain medical information by electronic transmission. I acknowledge that I have been given the opportunity to request an in-person assessment or other available alternative prior to the telemedicine visit and am voluntarily participating in the telemedicine visit.  I understand that I have the right to withhold or withdraw my consent to the use of telemedicine in the course of my care at any time, without affecting my right to future care or treatment, and that the Practitioner or I may terminate the telemedicine visit at any time. I understand that I have the right to  inspect all information obtained and/or recorded in the course of the telemedicine visit and may receive copies of available information for a reasonable fee.  I understand that some of the potential risks of receiving the Services via telemedicine include:   Delay or interruption in medical evaluation due to technological equipment failure or disruption;  Information transmitted may not be sufficient (e.g. poor resolution of images) to allow for appropriate medical decision making by the Practitioner; and/or   In rare instances, security protocols could fail, causing a breach of personal health information.  Furthermore, I acknowledge that it is my responsibility to provide information about my medical history, conditions and care that is complete and accurate to the best of my ability. I acknowledge that Practitioner's advice, recommendations, and/or decision may be based on factors not within their control, such as incomplete or inaccurate data provided by me or distortions of diagnostic images or specimens that may result from electronic transmissions. I understand that the practice of medicine is not an exact science and that Practitioner makes no warranties or guarantees regarding treatment outcomes. I acknowledge that I will receive a copy of this  consent concurrently upon execution via email to the email address I last provided but may also request a printed copy by calling the office of [facility].    I understand that my insurance will be billed for this visit.   I have read or had this consent read to me.  I understand the contents of this consent, which adequately explains the benefits and risks of the Services being provided via telemedicine.   I have been provided ample opportunity to ask questions regarding this consent and the Services and have had my questions answered to my satisfaction.  I give my informed consent for the services to be provided through the use of  telemedicine in my medical care  By participating in this telemedicine visit I agree to the above.  Patient gives verbal consent for televisit /pp

## 2018-06-16 NOTE — Telephone Encounter (Signed)
Left voicemail to return call about virtual visit °

## 2018-06-21 ENCOUNTER — Encounter: Payer: Self-pay | Admitting: Cardiology

## 2018-06-21 ENCOUNTER — Telehealth (INDEPENDENT_AMBULATORY_CARE_PROVIDER_SITE_OTHER): Payer: Medicare Other | Admitting: Cardiology

## 2018-06-21 ENCOUNTER — Other Ambulatory Visit: Payer: Self-pay

## 2018-06-21 VITALS — BP 127/62 | HR 58 | Ht 60.0 in | Wt 145.0 lb

## 2018-06-21 DIAGNOSIS — I2510 Atherosclerotic heart disease of native coronary artery without angina pectoris: Secondary | ICD-10-CM

## 2018-06-21 DIAGNOSIS — E782 Mixed hyperlipidemia: Secondary | ICD-10-CM

## 2018-06-21 DIAGNOSIS — I493 Ventricular premature depolarization: Secondary | ICD-10-CM

## 2018-06-21 MED ORDER — VALSARTAN 80 MG PO TABS: 80.0000 mg | ORAL_TABLET | Freq: Every day | ORAL | 1 refills | Status: DC

## 2018-06-21 MED ORDER — ATORVASTATIN CALCIUM 80 MG PO TABS: 80.0000 mg | ORAL_TABLET | Freq: Every evening | ORAL | 1 refills | Status: DC

## 2018-06-21 MED ORDER — PROPRANOLOL HCL 80 MG PO TABS: 80.0000 mg | ORAL_TABLET | Freq: Every day | ORAL | 1 refills | Status: DC

## 2018-06-21 NOTE — Patient Instructions (Addendum)
Medication Instructions:  Your physician has recommended you make the following change in your medication:  STOP: Ramipril START: Valsartan 80 mg (1 tab) daily   If you need a refill on your cardiac medications before your next appointment, please call your pharmacy.   Lab work: None If you have labs (blood work) drawn today and your tests are completely normal, you will receive your results only by: Marland Kitchen MyChart Message (if you have MyChart) OR . A paper copy in the mail If you have any lab test that is abnormal or we need to change your treatment, we will call you to review the results.  Testing/Procedures: None  Follow-Up: At [facility], you and your health needs are our priority.  As part of our continuing mission to provide you with exceptional heart care, we have created designated Provider Care Teams.  These Care Teams include your primary Cardiologist (physician) and Advanced Practice Providers (APPs -  Physician Assistants and Nurse Practitioners) who all work together to provide you with the care you need, when you need it. You will need a follow up appointment in 6 months.  Any Other Special Instructions Will Be Listed Below (If Applicable).    Valsartan tablets What is this medicine? VALSARTAN (val SAR tan) is used to treat high blood pressure. This drug is also used to treat patients with heart failure and patients who have had a heart attack. This medicine may be used for other purposes; ask your health care provider or pharmacist if you have questions. COMMON BRAND NAME(S): Diovan What should I tell my health care provider before I take this medicine? They need to know if you have any of these conditions: -heart failure -kidney disease -liver disease -an unusual or allergic reaction to valsartan, other medicines, foods, dyes, or preservatives -pregnant or trying to get pregnant -breast-feeding How should I use this medicine? Take this medicine by mouth with a  glass of water. Follow the directions on the prescription label. This medicine can be taken with or without food. Take your medicine at regular intervals. Do not take it more often than directed. Talk to your pediatrician regarding the use of this medicine in children. While this drug may be prescribed for children as young as 6 years for selected conditions, precautions do apply. Overdosage: If you think you have taken too much of this medicine contact a poison control center or emergency room at once. NOTE: This medicine is only for you. Do not share this medicine with others. What if I miss a dose? If you miss a dose, take it as soon as you can. If it is almost time for your next dose, take only that dose. Do not take double or extra doses. What may interact with this medicine? -blood pressure medicines -lithium -diuretics, especially triamterene, spironolactone or amiloride -potassium salts or potassium supplements This list may not describe all possible interactions. Give your health care provider a list of all the medicines, herbs, non-prescription drugs, or dietary supplements you use. Also tell them if you smoke, drink alcohol, or use illegal drugs. Some items may interact with your medicine. What should I watch for while using this medicine? Visit your doctor or health care professional for regular checks on your progress. Check your blood pressure as directed. Ask your doctor or health care professional what your blood pressure should be and when you should contact him or her. Call your doctor or health care professional if you notice an irregular or fast heart beat.  Women should inform their doctor if they wish to become pregnant or think they might be pregnant. There is a potential for serious side effects to an unborn child, particularly in the second or third trimester. Talk to your health care professional or pharmacist for more information. You may get drowsy or dizzy. Do not drive,  use machinery, or do anything that needs mental alertness until you know how this drug affects you. Do not stand or sit up quickly, especially if you are an older patient. This reduces the risk of dizzy or fainting spells. Alcohol can make you more drowsy and dizzy. Avoid alcoholic drinks. Avoid salt substitutes unless you are told otherwise by your doctor or health care professional. Do not treat yourself for coughs, colds, or pain while you are taking this medicine without asking your doctor or health care professional for advice. Some ingredients may increase your blood pressure. What side effects may I notice from receiving this medicine? Side effects that you should report to your doctor or health care professional as soon as possible: -confusion, dizziness, light headedness or fainting spells -decreased amount of urine passed -difficulty breathing or swallowing, hoarseness, or tightening of the throat -fast or irregular heart beat, palpitations, or chest pain -skin rash, itching -swelling of your face, lips, tongue, hands, or feet Side effects that usually do not require medical attention (report to your doctor or health care professional if they continue or are bothersome): -cough -decreased sexual function -headache -nausea or stomach pain This list may not describe all possible side effects. Call your doctor for medical advice about side effects. You may report side effects to FDA at 1-800-FDA-1088. Where should I keep my medicine? Keep out of the reach of children. Store at room temperature between 15 and 30 degrees C (59 and 86 degrees F). Keep your medicine container tightly closed and protect from moisture. Throw away any unused medicine after the expiration date. NOTE: This sheet is a summary. It may not cover all possible information. If you have questions about this medicine, talk to your doctor, pharmacist, or health care provider.  2019 Elsevier/Gold Standard (2012-05-20  12:39:59)

## 2018-06-21 NOTE — Progress Notes (Signed)
Virtual Visit via Video Note   This visit type was conducted due to national recommendations for restrictions regarding the COVID-19 Pandemic (e.g. social distancing) in an effort to limit this patient's exposure and mitigate transmission in our community.  Due to her co-morbid illnesses, this patient is at least at moderate risk for complications without adequate follow up.  This format is felt to be most appropriate for this patient at this time.  All issues noted in this document were discussed and addressed.  A limited physical exam was performed with this format.  Please refer to the patient's chart for her consent to telehealth for [facility].  Evaluation Performed:  Follow-up visit  This visit type was conducted due to national recommendations for restrictions regarding the COVID-19 Pandemic (e.g. social distancing).  This format is felt to be most appropriate for this patient at this time.  All issues noted in this document were discussed and addressed.  No physical exam was performed (except for noted visual exam findings with Video Visits).  Please refer to the patient's chart (MyChart message for video visits and phone note for telephone visits) for the patient's consent to telehealth for [facility].  Date:  06/21/2018  ID: Krista Contreras, DOB 1945-06-17, MRN [id number]   Patient Location: [address] [place] [address]   Provider location:   [facility] [place] [facility]  PCP:  Krista Rainier, MD  Cardiologist:  Krista Balsam, MD     Chief Complaint: Doing well  History of Present Illness:    Krista Contreras is a 73 y.o. female  who presents via audio/video conferencing for a telehealth visit today.  With history of coronary artery disease.  She got a functional test done in 2016 which showed some abnormality of LAD.  However she is completely asymptomatic the key management right now is to modify her risk factors for coronary artery disease.  She a try to  exercise on the regular basis she got stationary bike that she was every other day for about 10 to 15 minutes however she also told me that she can continue regular conversation when she does it.  She is taking high intensity statin as well as aspirin..  We decided to switch her ramipril to valsartan 80 mg daily   The patient does not have symptoms concerning for COVID-19 infection (fever, chills, cough, or new SHORTNESS OF BREATH).    Prior CV studies:   The following studies were reviewed today:  IMPRESSION: 1. Coronary calcium score of 273. This was 66 percentile for age and sex matched control.  2. Normal coronary origin.  Right dominance.  3. There is moderate mixed plaque with high risk features (napkin ring sign) in the proximal to mid LAD with associated stenosis 50-69%. An aggressive risk factor modification is recommended.  Krista Contreras      Past Medical History:  Diagnosis Date   Anxiety    Arthritis    CAD (coronary artery disease), native coronary artery    Coronary calcium score 281 July 2016     Depression    Family history of premature coronary artery disease    GERD (gastroesophageal reflux disease)    Hyperlipidemia    Hypertension     Past Surgical History:  Procedure Laterality Date   BREAST LUMPECTOMY WITH RADIOACTIVE SEED LOCALIZATION Right 07/24/2017   Procedure: BREAST LUMPECTOMY WITH RADIOACTIVE SEED LOCALIZATION;  Surgeon: Krista Fellows, MD;  Location: [facility] OR;  Service: General;  Laterality: Right;   BREAST  SURGERY     scar removed from left breast   CATARACT EXTRACTION Right    EYE SURGERY     retinia hole     Current Meds  Medication Sig   aspirin 81 MG tablet Take 81 mg by mouth daily.   atorvastatin (LIPITOR) 80 MG tablet Take 1 tablet (80 mg total) by mouth every evening.   calcium carbonate (CALCIUM 600) 600 MG TABS tablet Take 600 mg by mouth 2 (two) times daily with a meal.   cholecalciferol (VITAMIN D)  1000 UNITS tablet Take 2,000 Units by mouth daily.   Multiple Vitamins-Minerals (PRESERVISION AREDS 2 PO) Take 2 tablets by mouth daily.   propranolol (INDERAL) 80 MG tablet Take 1 tablet (80 mg total) by mouth daily.   ramipril (ALTACE) 5 MG capsule Take 1 capsule (5 mg total) by mouth daily.   sertraline (ZOLOFT) 25 MG tablet Take 25 mg by mouth daily.   [DISCONTINUED] atorvastatin (LIPITOR) 80 MG tablet Take 80 mg by mouth every evening.   [DISCONTINUED] propranolol (INDERAL) 80 MG tablet Take 80 mg by mouth daily.       Family History: The patient's family history is not on file.   ROS:   Please see the history of present illness.     All other systems reviewed and are negative.   Labs/Other Tests and Data Reviewed:     Recent Labs: 07/21/2017: ALT 24; BUN 7; Creatinine, Ser 0.80; Hemoglobin 13.3; Platelets 267; Potassium 4.3; Sodium 143  Recent Lipid Panel No results found for: CHOL, TRIG, HDL, CHOLHDL, VLDL, LDLCALC, LDLDIRECT    Exam:    Vital Signs:  BP 127/62    Pulse (!) 58    Ht 5' (1.524 m)    Wt 145 lb (65.8 kg)    BMI 28.32 kg/m     Wt Readings from Last 3 Encounters:  06/21/18 145 lb (65.8 kg)  07/21/17 146 lb 11.2 oz (66.5 kg)     Well nourished, well developed in no acute distress. She is alert awake oriented x3 she is sitting in her living room quite happy to be able to see me.  No JVD no swelling of lower extremities  Diagnosis for this visit:   1. Coronary artery disease involving native coronary artery of native heart without angina pectoris   2. Mixed hyperlipidemia   3. PVC (premature ventricular contraction)      ASSESSMENT & PLAN:    1.  Coronary artery disease.  Appears to be asymptomatic we did talk about risk factors modification which includes continuation of aspirin high intensity statin, exercises on the regular basis we also discussed the basic of her diet which should be Mediterranean diet.  She is talking about dieting to  lose weight however I told her the most important goal for her will be to reduce her risk factors for coronary artery disease.  She understood. 2.  Mixed dyslipidemia she is on high intensity statin which I will continue. 3.  PVCs.  Denies having any. 4.  Essential hypertension she said she got whitecoat hypertension when she check it at home her blood pressure is usually good.  COVID-19 Education: The signs and symptoms of COVID-19 were discussed with the patient and how to seek care for testing (follow up with PCP or arrange E-visit).  The importance of social distancing was discussed today.  Patient Risk:   After full review of this patients clinical status, I feel that they are at least moderate risk at  this time.  Time:   Today, I have spent 18 minutes with the patient with telehealth technology discussing pt health issues.  I spent 5 minutes reviewing her chart before the visit.  Visit was finished at 1:37 PM.    Medication Adjustments/Labs and Tests Ordered: Current medicines are reviewed at length with the patient today.  Concerns regarding medicines are outlined above.  No orders of the defined types were placed in this encounter.  Medication changes:  Meds ordered this encounter  Medications   atorvastatin (LIPITOR) 80 MG tablet    Sig: Take 1 tablet (80 mg total) by mouth every evening.    Dispense:  90 tablet    Refill:  1   propranolol (INDERAL) 80 MG tablet    Sig: Take 1 tablet (80 mg total) by mouth daily.    Dispense:  90 tablet    Refill:  1     Disposition: We will up in 6 months.  We will switch ramipril to valsartan 80 mg daily  Signed, Georgeanna Leaobert J. Welma Mccombs, MD, [facility] 06/21/2018 1:37 PM    [facility]

## 2018-12-07 ENCOUNTER — Telehealth: Payer: Medicare Other | Admitting: Cardiology

## 2018-12-13 ENCOUNTER — Telehealth: Payer: Medicare Other | Admitting: Cardiology

## 2018-12-14 ENCOUNTER — Other Ambulatory Visit: Payer: Self-pay | Admitting: Cardiology

## 2018-12-14 ENCOUNTER — Ambulatory Visit: Payer: Medicare Other | Admitting: Cardiology

## 2018-12-14 ENCOUNTER — Encounter: Payer: Self-pay | Admitting: Cardiology

## 2018-12-14 ENCOUNTER — Other Ambulatory Visit: Payer: Self-pay

## 2018-12-14 VITALS — BP 140/70 | HR 63 | Ht 60.0 in | Wt 150.1 lb

## 2018-12-14 DIAGNOSIS — I2510 Atherosclerotic heart disease of native coronary artery without angina pectoris: Secondary | ICD-10-CM

## 2018-12-14 DIAGNOSIS — E782 Mixed hyperlipidemia: Secondary | ICD-10-CM

## 2018-12-14 DIAGNOSIS — I493 Ventricular premature depolarization: Secondary | ICD-10-CM | POA: Diagnosis not present

## 2018-12-14 NOTE — Patient Instructions (Addendum)

## 2018-12-14 NOTE — Progress Notes (Signed)
Cardiology Office Note:    Date:  12/14/2018   ID:  Krista Contreras, DOB 1945-11-04, MRN [id number]  PCP:  Juluis Rainier, MD  Cardiologist:  Gypsy Balsam, MD    Referring MD: Juluis Rainier, MD   Chief Complaint  Patient presents with  . Follow-up  Doing well  History of Present Illness:    Krista Contreras is a 73 y.o. female with coronary artery disease sent by calcium score to 81 in July 2016, some anxiety, and PVCs, dyslipidemia comes today to my office for follow-up.  Overall she is very disappointed with herself because she gained some weight she stopped exercising on a regular basis.  We spent a great deal of time talking about healthy lifestyle today with talk about need to exercise I recommended 30 minutes 5 times a week of 45 minutes 3 times a week.  She does have a stationary bike as well as treadmill.  Also advised her to walk outside before is good.  She promised to do that she wants to lose at least 15 pounds within next 3 months.  Denies have any chest pain tightness squeezing pressure burning chest no palpitations overall seems to be doing well.  Past Medical History:  Diagnosis Date  . Anxiety   . Arthritis   . CAD (coronary artery disease), native coronary artery    Coronary calcium score 281 July 2016    . Depression   . Family history of premature coronary artery disease   . GERD (gastroesophageal reflux disease)   . Hyperlipidemia   . Hypertension     Past Surgical History:  Procedure Laterality Date  . BREAST LUMPECTOMY WITH RADIOACTIVE SEED LOCALIZATION Right 07/24/2017   Procedure: BREAST LUMPECTOMY WITH RADIOACTIVE SEED LOCALIZATION;  Surgeon: Glenna Fellows, MD;  Location: [facility] OR;  Service: General;  Laterality: Right;  . BREAST SURGERY     scar removed from left breast  . CATARACT EXTRACTION Right   . EYE SURGERY     retinia hole    Current Medications: Current Meds  Medication Sig  . aspirin 81 MG tablet Take 81 mg by mouth daily.   Marland Kitchen atorvastatin (LIPITOR) 80 MG tablet Take 1 tablet (80 mg total) by mouth every evening.  . calcium carbonate (CALCIUM 600) 600 MG TABS tablet Take 600 mg by mouth 2 (two) times daily with a meal.  . cholecalciferol (VITAMIN D) 1000 UNITS tablet Take 2,000 Units by mouth daily.  . meclizine (ANTIVERT) 25 MG tablet Take 1 tablet by mouth as needed for dizziness.  . Multiple Vitamins-Minerals (PRESERVISION AREDS 2 PO) Take 2 tablets by mouth daily.  . propranolol (INDERAL) 80 MG tablet Take 1 tablet (80 mg total) by mouth daily.  . sertraline (ZOLOFT) 25 MG tablet Take 25 mg by mouth daily.  . valsartan (DIOVAN) 80 MG tablet TAKE 1 TABLET BY MOUTH EVERY DAY     Allergies:   Other   Social History   Socioeconomic History  . Marital status: Widowed    Spouse name: Not on file  . Number of children: Not on file  . Years of education: Not on file  . Highest education level: Not on file  Occupational History  . Not on file  Social Needs  . Financial resource strain: Not on file  . Food insecurity    Worry: Not on file    Inability: Not on file  . Transportation needs    Medical: Not on file    Non-medical: Not on file  Tobacco Use  . Smoking status: Former Research scientist (life sciences)  . Smokeless tobacco: Never Used  Substance and Sexual Activity  . Alcohol use: Never    Frequency: Never  . Drug use: Never  . Sexual activity: Not on file  Lifestyle  . Physical activity    Days per week: Not on file    Minutes per session: Not on file  . Stress: Not on file  Relationships  . Social Herbalist on phone: Not on file    Gets together: Not on file    Attends religious service: Not on file    Active member of club or organization: Not on file    Attends meetings of clubs or organizations: Not on file    Relationship status: Not on file  Other Topics Concern  . Not on file  Social History Narrative  . Not on file     Family History: The patient's family history is not on file.  ROS:   Please see the history of present illness.    All 14 point review of systems negative except as described per history of present illness  EKGs/Labs/Other Studies Reviewed:      Recent Labs: No results found for requested labs within last 8760 hours.  Recent Lipid Panel No results found for: CHOL, TRIG, HDL, CHOLHDL, VLDL, LDLCALC, LDLDIRECT  Physical Exam:    VS:  BP 140/70   Pulse 63   Ht 5' (1.524 m)   Wt 150 lb 1.9 oz (68.1 kg)   SpO2 96%   BMI 29.32 kg/m     Wt Readings from Last 3 Encounters:  12/14/18 150 lb 1.9 oz (68.1 kg)  06/21/18 145 lb (65.8 kg)  07/21/17 146 lb 11.2 oz (66.5 kg)     GEN:  Well nourished, well developed in no acute distress HEENT: Normal NECK: No JVD; No carotid bruits LYMPHATICS: No lymphadenopathy CARDIAC: RRR, no murmurs, no rubs, no gallops RESPIRATORY:  Clear to auscultation without rales, wheezing or rhonchi  ABDOMEN: Soft, non-tender, non-distended MUSCULOSKELETAL:  No edema; No deformity  SKIN: Warm and dry LOWER EXTREMITIES: no swelling NEUROLOGIC:  Alert and oriented x 3 PSYCHIATRIC:  Normal affect   ASSESSMENT:    1. Coronary artery disease involving native coronary artery of native heart without angina pectoris   2. PVC (premature ventricular contraction)   3. Mixed hyperlipidemia    PLAN:    In order of problems listed above:  1. Coronary disease with high calcium score but nonobstructive disease asymptomatic.  Doing well from that point review plan as outlined above he is risk factors modifications. PVCs denies having any Mixed dyslipidemia she is scheduled to see her primary care physician next week we will do her fasting lipid profile.  Will wait for results of it.  See her back 6 months she promised me that she will lose at least 10 pounds until then.   Medication Adjustments/Labs and Tests Ordered: Current medicines are reviewed at length with the patient today.  Concerns regarding medicines are  outlined above.  No orders of the defined types were placed in this encounter.  Medication changes: No orders of the defined types were placed in this encounter.   Signed, Park Liter, MD, [facility] 12/14/2018 2:47 PM    [place]

## 2018-12-15 MED ORDER — PROPRANOLOL HCL ER 80 MG PO CP24: 80.0000 mg | ORAL_CAPSULE | Freq: Every day | ORAL | 1 refills | Status: DC

## 2018-12-15 NOTE — Progress Notes (Signed)
Called patient informed her that we switched her propranolol to the long acting pill still 80 mg daily per Dr. Agustin Cree. Patient verbally understands. No further questions.

## 2018-12-15 NOTE — Addendum Note (Signed)
Addended by: Ashok Norris on: 12/15/2018 09:19 AM   Modules accepted: Orders

## 2018-12-16 ENCOUNTER — Other Ambulatory Visit: Payer: Self-pay | Admitting: Cardiology

## 2018-12-16 DIAGNOSIS — I2510 Atherosclerotic heart disease of native coronary artery without angina pectoris: Secondary | ICD-10-CM | POA: Insufficient documentation

## 2019-05-17 ENCOUNTER — Other Ambulatory Visit: Payer: Self-pay | Admitting: Cardiology

## 2019-05-17 NOTE — Telephone Encounter (Signed)
*  STAT* If patient is at the pharmacy, call can be transferred to refill team.   1. Which medications need to be refilled? (please list name of each medication and dose if known) atorvastatin (LIPITOR) 80 MG tablet / valsartan (DIOVAN) 80 MG tablet / propranolol ER (INDERAL LA) 80 MG 24 hr capsule    2. Which pharmacy/location (including street and city if local pharmacy) is medication to be sent to? [facility]  3. Do they need a 30 day or 90 day supply? 90

## 2019-05-18 MED ORDER — VALSARTAN 80 MG PO TABS: 80.0000 mg | ORAL_TABLET | Freq: Every day | ORAL | 3 refills | Status: DC

## 2019-05-18 MED ORDER — PROPRANOLOL HCL ER 80 MG PO CP24: 80.0000 mg | ORAL_CAPSULE | Freq: Every day | ORAL | 3 refills | Status: DC

## 2019-05-18 MED ORDER — ATORVASTATIN CALCIUM 80 MG PO TABS: 80.0000 mg | ORAL_TABLET | Freq: Every evening | ORAL | 3 refills | Status: DC

## 2019-05-18 MED ORDER — ATORVASTATIN CALCIUM 80 MG PO TABS: 80.0000 mg | ORAL_TABLET | Freq: Every evening | ORAL | 1 refills | Status: DC

## 2019-05-18 MED ORDER — VALSARTAN 80 MG PO TABS: 80.0000 mg | ORAL_TABLET | Freq: Every day | ORAL | 1 refills | Status: DC

## 2019-05-18 MED ORDER — PROPRANOLOL HCL ER 80 MG PO CP24: 80.0000 mg | ORAL_CAPSULE | Freq: Every day | ORAL | 1 refills | Status: DC

## 2019-05-18 NOTE — Telephone Encounter (Signed)
Left message for pt letting her know rx were sent to the [facility].

## 2019-05-18 NOTE — Addendum Note (Signed)
Addended by: Len Kluver, Elmarie Shiley L on: 05/18/2019 09:17 AM   Modules accepted: Orders

## 2019-05-19 DIAGNOSIS — Z961 Presence of intraocular lens: Secondary | ICD-10-CM

## 2019-05-19 HISTORY — DX: Presence of intraocular lens: Z96.1

## 2019-06-21 ENCOUNTER — Encounter: Payer: Self-pay | Admitting: Cardiology

## 2019-06-21 ENCOUNTER — Other Ambulatory Visit: Payer: Self-pay

## 2019-06-21 ENCOUNTER — Ambulatory Visit: Payer: Medicare Other | Admitting: Cardiology

## 2019-06-21 VITALS — BP 140/74 | HR 59 | Ht 60.0 in | Wt 152.0 lb

## 2019-06-21 DIAGNOSIS — Z8249 Family history of ischemic heart disease and other diseases of the circulatory system: Secondary | ICD-10-CM

## 2019-06-21 DIAGNOSIS — I493 Ventricular premature depolarization: Secondary | ICD-10-CM

## 2019-06-21 DIAGNOSIS — E782 Mixed hyperlipidemia: Secondary | ICD-10-CM

## 2019-06-21 DIAGNOSIS — I2510 Atherosclerotic heart disease of native coronary artery without angina pectoris: Secondary | ICD-10-CM | POA: Diagnosis not present

## 2019-06-21 NOTE — Progress Notes (Signed)
Cardiology Office Note:    Date:  06/21/2019   ID:  Krista Contreras, DOB 19-Oct-1945, MRN [id number]  PCP:  Leighton Ruff, MD  Cardiologist:  Jenne Campus, MD    Referring MD: Leighton Ruff, MD   No chief complaint on file. Doing well, but very disappointed about the fact that she did not lose weight  History of Present Illness:    Krista Contreras is a 74 y.o. female with coronary artery disease as proven by calcium score being 281, she does have history of family history of premature coronary artery disease as well as essential hypertension.  Denies have any symptoms.  No chest pain tightness squeezing pressure burning chest.  She is very disappointed with herself because last time when I see her 6 months ago she said she is going to lose 15 pounds but she did not do it.  Luckily clinically doing well.  Past Medical History:  Diagnosis Date  . Anxiety   . Arthritis   . CAD (coronary artery disease), native coronary artery    Coronary calcium score 281 July 2016    . Depression   . Family history of premature coronary artery disease   . GERD (gastroesophageal reflux disease)   . Hyperlipidemia   . Hypertension     Past Surgical History:  Procedure Laterality Date  . BREAST LUMPECTOMY WITH RADIOACTIVE SEED LOCALIZATION Right 07/24/2017   Procedure: BREAST LUMPECTOMY WITH RADIOACTIVE SEED LOCALIZATION;  Surgeon: Excell Seltzer, MD;  Location: [facility];  Service: General;  Laterality: Right;  . BREAST SURGERY     scar removed from left breast  . CATARACT EXTRACTION Right   . EYE SURGERY     retinia hole    Current Medications: Current Meds  Medication Sig  . aspirin 81 MG tablet Take 81 mg by mouth daily.  Marland Kitchen atorvastatin (LIPITOR) 80 MG tablet Take 1 tablet (80 mg total) by mouth every evening.  . calcium carbonate (CALCIUM 600) 600 MG TABS tablet Take 600 mg by mouth 2 (two) times daily with a meal.  . cholecalciferol (VITAMIN D) 1000 UNITS tablet Take 2,000  Units by mouth daily.  . meclizine (ANTIVERT) 25 MG tablet Take 1 tablet by mouth as needed for dizziness.  . Multiple Vitamins-Minerals (PRESERVISION AREDS 2 PO) Take 2 tablets by mouth daily.  . propranolol ER (INDERAL LA) 80 MG 24 hr capsule Take 1 capsule (80 mg total) by mouth daily.  . sertraline (ZOLOFT) 25 MG tablet Take 25 mg by mouth daily.  . valsartan (DIOVAN) 80 MG tablet Take 1 tablet (80 mg total) by mouth daily.     Allergies:   Other   Social History   Socioeconomic History  . Marital status: Widowed    Spouse name: Not on file  . Number of children: Not on file  . Years of education: Not on file  . Highest education level: Not on file  Occupational History  . Not on file  Tobacco Use  . Smoking status: Former Research scientist (life sciences)  . Smokeless tobacco: Never Used  Substance and Sexual Activity  . Alcohol use: Never  . Drug use: Never  . Sexual activity: Not on file  Other Topics Concern  . Not on file  Social History Narrative  . Not on file   Social Determinants of Health   Financial Resource Strain:   . Difficulty of Paying Living Expenses:   Food Insecurity:   . Worried About Charity fundraiser in the Last Year:   .  Ran Out of Food in the Last Year:   Transportation Needs:   . Freight forwarder (Medical):   Marland Kitchen Lack of Transportation (Non-Medical):   Physical Activity:   . Days of Exercise per Week:   . Minutes of Exercise per Session:   Stress:   . Feeling of Stress :   Social Connections:   . Frequency of Communication with Friends and Family:   . Frequency of Social Gatherings with Friends and Family:   . Attends Religious Services:   . Active Member of Clubs or Organizations:   . Attends Banker Meetings:   Marland Kitchen Marital Status:      Family History: The patient's family history is not on file. ROS:   Please see the history of present illness.    All 14 point review of systems negative except as described per history of present  illness  EKGs/Labs/Other Studies Reviewed:      Recent Labs: No results found for requested labs within last 8760 hours.  Recent Lipid Panel No results found for: CHOL, TRIG, HDL, CHOLHDL, VLDL, LDLCALC, LDLDIRECT  Physical Exam:    VS:  BP 140/74   Pulse (!) 59   Ht 5' (1.524 m)   Wt 152 lb (68.9 kg)   SpO2 97%   BMI 29.69 kg/m     Wt Readings from Last 3 Encounters:  06/21/19 152 lb (68.9 kg)  12/14/18 150 lb 1.9 oz (68.1 kg)  06/21/18 145 lb (65.8 kg)     GEN:  Well nourished, well developed in no acute distress HEENT: Normal NECK: No JVD; No carotid bruits LYMPHATICS: No lymphadenopathy CARDIAC: RRR, no murmurs, no rubs, no gallops RESPIRATORY:  Clear to auscultation without rales, wheezing or rhonchi  ABDOMEN: Soft, non-tender, non-distended MUSCULOSKELETAL:  No edema; No deformity  SKIN: Warm and dry LOWER EXTREMITIES: no swelling NEUROLOGIC:  Alert and oriented x 3 PSYCHIATRIC:  Normal affect   ASSESSMENT:    1. Coronary artery disease involving native coronary artery of native heart without angina pectoris   2. PVC (premature ventricular contraction)   3. Mixed hyperlipidemia   4. Family history of premature coronary artery disease    PLAN:    In order of problems listed above:  1. Coronary disease calcium score 281.  We will continue risk factors modifications.  Again long discussion with the patient we talked about diet, exercise is hopefully I will be able to convince her to exercise on a regular basis. 2. PVCs.  Noted denies having palpitations.  We will continue small dose of beta-blocker. 3. Mixed dyslipidemia.  I do have her fasting lipid profile showing LDL of 86, HDL of 33.  This is on high intensity statin.  She takes Lipitor 80 which I will continue.  I reviewed K PN for this data. 4. Family history of premature coronary artery disease.  Noted.  Long discussion about risk factors modification hopefully I was able to convince her to start  exercising on the regular basis we also talk about basic of Mediterranean diet.   Medication Adjustments/Labs and Tests Ordered: Current medicines are reviewed at length with the patient today.  Concerns regarding medicines are outlined above.  Orders Placed This Encounter  Procedures  . EKG 12-Lead   Medication changes: No orders of the defined types were placed in this encounter.   Signed, Georgeanna Lea, MD, [facility] 06/21/2019 2:45 PM    [facility]

## 2019-06-21 NOTE — Patient Instructions (Signed)
Medication Instructions:  °Your physician recommends that you continue on your current medications as directed. Please refer to the Current Medication list given to you today. ° °*If you need a refill on your cardiac medications before your next appointment, please call your pharmacy* ° ° °Lab Work: °None ordered ° °If you have labs (blood work) drawn today and your tests are completely normal, you will receive your results only by: °MyChart Message (if you have MyChart) OR °A paper copy in the mail °If you have any lab test that is abnormal or we need to change your treatment, we will call you to review the results. ° ° °Testing/Procedures: °None ordered ° ° °Follow-Up: °At [facility], you and your health needs are our priority.  As part of our continuing mission to provide you with exceptional heart care, we have created designated Provider Care Teams.  These Care Teams include your primary Cardiologist (physician) and Advanced Practice Providers (APPs -  Physician Assistants and Nurse Practitioners) who all work together to provide you with the care you need, when you need it. ° °We recommend signing up for the patient portal called "MyChart".  Sign up information is provided on this After Visit Summary.  MyChart is used to connect with patients for Virtual Visits (Telemedicine).  Patients are able to view lab/test results, encounter notes, upcoming appointments, etc.  Non-urgent messages can be sent to your provider as well.   °To learn more about what you can do with MyChart, go to [email or web address].   ° °Your next appointment:   °6 month(s) ° °The format for your next appointment:   °In Person ° °Provider:   °Robert Krasowski, MD  ° ° °Other Instructions ° ° °

## 2020-01-14 ENCOUNTER — Ambulatory Visit: Payer: Medicare Other

## 2020-02-27 ENCOUNTER — Ambulatory Visit: Payer: Medicare Other | Admitting: Cardiology

## 2020-03-07 DIAGNOSIS — R002 Palpitations: Secondary | ICD-10-CM

## 2020-03-07 DIAGNOSIS — R2689 Other abnormalities of gait and mobility: Secondary | ICD-10-CM

## 2020-03-07 DIAGNOSIS — F32A Depression, unspecified: Secondary | ICD-10-CM | POA: Insufficient documentation

## 2020-03-07 DIAGNOSIS — M810 Age-related osteoporosis without current pathological fracture: Secondary | ICD-10-CM | POA: Insufficient documentation

## 2020-03-07 DIAGNOSIS — R7303 Prediabetes: Secondary | ICD-10-CM

## 2020-03-07 DIAGNOSIS — K219 Gastro-esophageal reflux disease without esophagitis: Secondary | ICD-10-CM | POA: Insufficient documentation

## 2020-03-07 DIAGNOSIS — E559 Vitamin D deficiency, unspecified: Secondary | ICD-10-CM

## 2020-03-07 DIAGNOSIS — M1990 Unspecified osteoarthritis, unspecified site: Secondary | ICD-10-CM | POA: Insufficient documentation

## 2020-03-07 DIAGNOSIS — I10 Essential (primary) hypertension: Secondary | ICD-10-CM | POA: Insufficient documentation

## 2020-03-07 DIAGNOSIS — F419 Anxiety disorder, unspecified: Secondary | ICD-10-CM | POA: Insufficient documentation

## 2020-03-07 DIAGNOSIS — K5730 Diverticulosis of large intestine without perforation or abscess without bleeding: Secondary | ICD-10-CM

## 2020-03-07 HISTORY — DX: Other abnormalities of gait and mobility: R26.89

## 2020-03-07 HISTORY — DX: Vitamin D deficiency, unspecified: E55.9

## 2020-03-07 HISTORY — DX: Diverticulosis of large intestine without perforation or abscess without bleeding: K57.30

## 2020-03-07 HISTORY — DX: Prediabetes: R73.03

## 2020-03-07 HISTORY — DX: Palpitations: R00.2

## 2020-03-08 ENCOUNTER — Encounter: Payer: Self-pay | Admitting: Cardiology

## 2020-03-08 ENCOUNTER — Ambulatory Visit: Payer: Medicare Other | Admitting: Cardiology

## 2020-03-08 ENCOUNTER — Other Ambulatory Visit: Payer: Self-pay

## 2020-03-08 VITALS — BP 128/68 | HR 63 | Ht 60.0 in | Wt 146.0 lb

## 2020-03-08 DIAGNOSIS — I2510 Atherosclerotic heart disease of native coronary artery without angina pectoris: Secondary | ICD-10-CM

## 2020-03-08 DIAGNOSIS — I10 Essential (primary) hypertension: Secondary | ICD-10-CM

## 2020-03-08 DIAGNOSIS — I493 Ventricular premature depolarization: Secondary | ICD-10-CM

## 2020-03-08 NOTE — Progress Notes (Signed)
Cardiology Office Note:    Date:  03/08/2020   ID:  Krista Contreras, DOB January 12, 1946, MRN [id number]  PCP:  Juluis Rainier, MD  Cardiologist:  Gypsy Balsam, MD    Referring MD: Juluis Rainier, MD   Chief Complaint  Patient presents with  . Follow-up  Doing fine  History of Present Illness:    Krista Contreras is a 75 y.o. female with past medical history significant for coronary disease she did have calcium score of 281 likely completely asymptomatic.  She does have family history of premature coronary artery disease, essential hypertension, dyslipidemia.  Comes today to my office for follow-up.  Overall doing well.  She was active and she was keeping diet however in December because of holiday she slack off that and she is disappointed with her.  But overall gradually getting better she is fine to go back to her routine both with exercises as well as with diet.  Past Medical History:  Diagnosis Date  . Abnormal cardiac function test 10/27/2014  . Anxiety   . Arthritis   . Bilateral dry eyes 10/19/2017  . BMI 28.0-28.9,adult 12/22/2017  . CAD (coronary artery disease), native coronary artery    Coronary calcium score 281 July 2016    . Depression   . Dermatochalasis of both upper eyelids 10/19/2017  . Diverticular disease of colon 03/07/2020  . Early stage nonexudative age-related macular degeneration of both eyes 05/06/2018  . Family history of premature coronary artery disease   . Gas pain 12/22/2017  . GERD (gastroesophageal reflux disease)   . Heartburn 12/22/2017  . Hypercholesterolemia   . Hyperlipidemia   . Hypertension   . Impairment of balance 03/07/2020  . Keratoconus, bilateral 04/09/2011  . Macular hole, right 04/09/2011  . Nuclear sclerosis, left 05/14/2012  . Other reasons for seeking consultation 12/03/2011  . Palpitations 03/07/2020  . Prediabetes 03/07/2020  . Pseudophakia of both eyes 05/19/2019  . PVC (premature ventricular contraction) 01/08/2017   Noted on event  monitor 10/18  Formatting of this note might be different from the original. Overview:  Noted on event monitor 10/18  . Retinal tears, multiple, without detachment, left 10/09/2011  . Status post intraocular lens implant 10/09/2011  . Vitamin D deficiency 03/07/2020    Past Surgical History:  Procedure Laterality Date  . BREAST LUMPECTOMY WITH RADIOACTIVE SEED LOCALIZATION Right 07/24/2017   Procedure: BREAST LUMPECTOMY WITH RADIOACTIVE SEED LOCALIZATION;  Surgeon: Glenna Fellows, MD;  Location: [facility];  Service: General;  Laterality: Right;  . BREAST SURGERY     scar removed from left breast  . CATARACT EXTRACTION Right   . EYE SURGERY     retinia hole    Current Medications: Current Meds  Medication Sig  . aspirin 81 MG tablet Take 81 mg by mouth daily.  Marland Kitchen atorvastatin (LIPITOR) 80 MG tablet Take 1 tablet (80 mg total) by mouth every evening.  . calcium carbonate (OS-CAL) 600 MG TABS tablet Take 600 mg by mouth 2 (two) times daily with a meal.  . cholecalciferol (VITAMIN D) 1000 UNITS tablet Take 2,000 Units by mouth daily.  . meclizine (ANTIVERT) 25 MG tablet Take 1 tablet by mouth as needed for dizziness.  . Multiple Vitamins-Minerals (PRESERVISION AREDS 2 PO) Take 2 tablets by mouth daily.  . propranolol ER (INDERAL LA) 80 MG 24 hr capsule Take 1 capsule (80 mg total) by mouth daily.  . sertraline (ZOLOFT) 50 MG tablet Take 50 mg by mouth daily.  . valsartan (DIOVAN) 80 MG  tablet Take 1 tablet (80 mg total) by mouth daily.  . [DISCONTINUED] sertraline (ZOLOFT) 25 MG tablet Take 25 mg by mouth daily.     Allergies:   Other   Social History   Socioeconomic History  . Marital status: Widowed    Spouse name: Not on file  . Number of children: Not on file  . Years of education: Not on file  . Highest education level: Not on file  Occupational History  . Not on file  Tobacco Use  . Smoking status: Former Research scientist (life sciences)  . Smokeless tobacco: Never Used  Vaping Use  . Vaping Use:  Never used  Substance and Sexual Activity  . Alcohol use: Never  . Drug use: Never  . Sexual activity: Not on file  Other Topics Concern  . Not on file  Social History Narrative  . Not on file   Social Determinants of Health   Financial Resource Strain: Not on file  Food Insecurity: Not on file  Transportation Needs: Not on file  Physical Activity: Not on file  Stress: Not on file  Social Connections: Not on file     Family History: The patient's family history is not on file. ROS:   Please see the history of present illness.    All 14 point review of systems negative except as described per history of present illness  EKGs/Labs/Other Studies Reviewed:      Recent Labs: No results found for requested labs within last 8760 hours.  Recent Lipid Panel No results found for: CHOL, TRIG, HDL, CHOLHDL, VLDL, LDLCALC, LDLDIRECT  Physical Exam:    VS:  BP 128/68 (BP Location: Left Arm, Patient Position: Sitting)   Pulse 63   Ht 5' (1.524 m)   Wt 146 lb (66.2 kg)   SpO2 93%   BMI 28.51 kg/m     Wt Readings from Last 3 Encounters:  03/08/20 146 lb (66.2 kg)  06/21/19 152 lb (68.9 kg)  12/14/18 150 lb 1.9 oz (68.1 kg)     GEN:  Well nourished, well developed in no acute distress HEENT: Normal NECK: No JVD; No carotid bruits LYMPHATICS: No lymphadenopathy CARDIAC: RRR, no murmurs, no rubs, no gallops RESPIRATORY:  Clear to auscultation without rales, wheezing or rhonchi  ABDOMEN: Soft, non-tender, non-distended MUSCULOSKELETAL:  No edema; No deformity  SKIN: Warm and dry LOWER EXTREMITIES: no swelling NEUROLOGIC:  Alert and oriented x 3 PSYCHIATRIC:  Normal affect   ASSESSMENT:    1. Coronary artery disease involving native coronary artery of native heart without angina pectoris   2. Primary hypertension   3. PVC (premature ventricular contraction)    PLAN:    In order of problems listed above:  1. Coronary disease as proven by calcium score being  elevated.  Likely completely asymptomatic the key is risk factors modifications we did talk about diet and need to exercise which she does since then. 2. Essential hypertension blood pressure seems to be well controlled continue present management. 3. PVCs denies having any. 4. Dyslipidemia: She is taking high intensity statin which I will continue, I do have her fasting lipid profile from 07 February 2020 showing LDL of 91 HDL 45.  Again this is on high intense statin which I will continue.   Medication Adjustments/Labs and Tests Ordered: Current medicines are reviewed at length with the patient today.  Concerns regarding medicines are outlined above.  No orders of the defined types were placed in this encounter.  Medication changes: No orders of  the defined types were placed in this encounter.   Signed, Georgeanna Lea, MD, [facility] 03/08/2020 1:25 PM    [facility]

## 2020-03-08 NOTE — Patient Instructions (Signed)

## 2020-03-19 DIAGNOSIS — M810 Age-related osteoporosis without current pathological fracture: Secondary | ICD-10-CM | POA: Diagnosis not present

## 2020-03-19 DIAGNOSIS — E7800 Pure hypercholesterolemia, unspecified: Secondary | ICD-10-CM | POA: Diagnosis not present

## 2020-03-19 DIAGNOSIS — I2510 Atherosclerotic heart disease of native coronary artery without angina pectoris: Secondary | ICD-10-CM | POA: Diagnosis not present

## 2020-03-26 DIAGNOSIS — H353131 Nonexudative age-related macular degeneration, bilateral, early dry stage: Secondary | ICD-10-CM | POA: Diagnosis not present

## 2020-03-26 DIAGNOSIS — Z961 Presence of intraocular lens: Secondary | ICD-10-CM | POA: Diagnosis not present

## 2020-03-26 DIAGNOSIS — H35341 Macular cyst, hole, or pseudohole, right eye: Secondary | ICD-10-CM | POA: Diagnosis not present

## 2020-03-26 DIAGNOSIS — H33332 Multiple defects of retina without detachment, left eye: Secondary | ICD-10-CM | POA: Diagnosis not present

## 2020-03-26 DIAGNOSIS — H18603 Keratoconus, unspecified, bilateral: Secondary | ICD-10-CM | POA: Diagnosis not present

## 2020-04-10 DIAGNOSIS — R0989 Other specified symptoms and signs involving the circulatory and respiratory systems: Secondary | ICD-10-CM | POA: Diagnosis not present

## 2020-04-10 DIAGNOSIS — R059 Cough, unspecified: Secondary | ICD-10-CM | POA: Diagnosis not present

## 2020-04-15 DIAGNOSIS — I2510 Atherosclerotic heart disease of native coronary artery without angina pectoris: Secondary | ICD-10-CM | POA: Diagnosis not present

## 2020-04-15 DIAGNOSIS — E7800 Pure hypercholesterolemia, unspecified: Secondary | ICD-10-CM | POA: Diagnosis not present

## 2020-04-15 DIAGNOSIS — M810 Age-related osteoporosis without current pathological fracture: Secondary | ICD-10-CM | POA: Diagnosis not present

## 2020-05-11 ENCOUNTER — Other Ambulatory Visit: Payer: Self-pay | Admitting: Cardiology

## 2020-05-11 DIAGNOSIS — M810 Age-related osteoporosis without current pathological fracture: Secondary | ICD-10-CM | POA: Diagnosis not present

## 2020-05-11 DIAGNOSIS — E7800 Pure hypercholesterolemia, unspecified: Secondary | ICD-10-CM | POA: Diagnosis not present

## 2020-05-11 DIAGNOSIS — I2510 Atherosclerotic heart disease of native coronary artery without angina pectoris: Secondary | ICD-10-CM | POA: Diagnosis not present

## 2020-05-18 ENCOUNTER — Other Ambulatory Visit: Payer: Self-pay | Admitting: Cardiology

## 2020-11-08 DIAGNOSIS — I2510 Atherosclerotic heart disease of native coronary artery without angina pectoris: Secondary | ICD-10-CM | POA: Diagnosis not present

## 2020-11-08 DIAGNOSIS — E7800 Pure hypercholesterolemia, unspecified: Secondary | ICD-10-CM | POA: Diagnosis not present

## 2020-11-08 DIAGNOSIS — M810 Age-related osteoporosis without current pathological fracture: Secondary | ICD-10-CM | POA: Diagnosis not present

## 2021-01-17 DIAGNOSIS — Z0000 Encounter for general adult medical examination without abnormal findings: Secondary | ICD-10-CM | POA: Diagnosis not present

## 2021-02-05 DIAGNOSIS — R7303 Prediabetes: Secondary | ICD-10-CM | POA: Diagnosis not present

## 2021-02-05 DIAGNOSIS — Z23 Encounter for immunization: Secondary | ICD-10-CM | POA: Diagnosis not present

## 2021-02-05 DIAGNOSIS — E7800 Pure hypercholesterolemia, unspecified: Secondary | ICD-10-CM | POA: Diagnosis not present

## 2021-02-05 DIAGNOSIS — I2510 Atherosclerotic heart disease of native coronary artery without angina pectoris: Secondary | ICD-10-CM | POA: Diagnosis not present

## 2021-02-05 DIAGNOSIS — M810 Age-related osteoporosis without current pathological fracture: Secondary | ICD-10-CM | POA: Diagnosis not present

## 2021-04-04 DIAGNOSIS — H353132 Nonexudative age-related macular degeneration, bilateral, intermediate dry stage: Secondary | ICD-10-CM | POA: Diagnosis not present

## 2021-04-04 DIAGNOSIS — H35341 Macular cyst, hole, or pseudohole, right eye: Secondary | ICD-10-CM | POA: Diagnosis not present

## 2021-04-04 DIAGNOSIS — H33332 Multiple defects of retina without detachment, left eye: Secondary | ICD-10-CM | POA: Diagnosis not present

## 2021-04-04 DIAGNOSIS — H18603 Keratoconus, unspecified, bilateral: Secondary | ICD-10-CM | POA: Diagnosis not present

## 2021-04-25 ENCOUNTER — Encounter: Payer: Self-pay | Admitting: Cardiology

## 2021-04-25 ENCOUNTER — Ambulatory Visit: Payer: Medicare Other | Admitting: Cardiology

## 2021-04-25 ENCOUNTER — Other Ambulatory Visit: Payer: Self-pay

## 2021-04-25 VITALS — BP 130/74 | HR 67 | Ht 60.0 in | Wt 146.1 lb

## 2021-04-25 DIAGNOSIS — R0789 Other chest pain: Secondary | ICD-10-CM | POA: Diagnosis not present

## 2021-04-25 DIAGNOSIS — R079 Chest pain, unspecified: Secondary | ICD-10-CM

## 2021-04-25 DIAGNOSIS — E7800 Pure hypercholesterolemia, unspecified: Secondary | ICD-10-CM

## 2021-04-25 DIAGNOSIS — I493 Ventricular premature depolarization: Secondary | ICD-10-CM

## 2021-04-25 DIAGNOSIS — I2510 Atherosclerotic heart disease of native coronary artery without angina pectoris: Secondary | ICD-10-CM | POA: Diagnosis not present

## 2021-04-25 DIAGNOSIS — I10 Essential (primary) hypertension: Secondary | ICD-10-CM | POA: Diagnosis not present

## 2021-04-25 NOTE — Progress Notes (Signed)
Cardiology Office Note:    Date:  04/25/2021   ID:  Krista Contreras, DOB [birth year], MRN [id number]  PCP:  Juluis Rainier, MD (Inactive)  Cardiologist:  Gypsy Balsam, MD    Referring MD: No ref. provider found   No chief complaint on file. I lost my husband  History of Present Illness:    Krista Contreras is a 76 y.o. female with past medical history significant for coronary artery disease she does have cardiac calcium score done which showed 281 likely until now she has been completely asymptomatic.  She does have family history of premature coronary artery disease, she also have essential hypertension, dyslipidemia.  She lost her husband in November.  She is obviously greatly she is actually trying my office and she said with very stressful situation she would have tightness chest.  Otherwise seems to be doing well but does not exercise as much as she used to  Past Medical History:  Diagnosis Date   Abnormal cardiac function test 10/27/2014   Anxiety    Arthritis    Bilateral dry eyes 10/19/2017   BMI 28.0-28.9,adult 12/22/2017   CAD (coronary artery disease), native coronary artery    Coronary calcium score 281 July 2016     Depression    Dermatochalasis of both upper eyelids 10/19/2017   Diverticular disease of colon 03/07/2020   Early stage nonexudative age-related macular degeneration of both eyes 05/06/2018   Family history of premature coronary artery disease    Gas pain 12/22/2017   GERD (gastroesophageal reflux disease)    Heartburn 12/22/2017   Hypercholesterolemia    Hyperlipidemia    Hypertension    Impairment of balance 03/07/2020   Keratoconus, bilateral 04/09/2011   Macular hole, right 04/09/2011   Nuclear sclerosis, left 05/14/2012   Other reasons for seeking consultation 12/03/2011   Palpitations 03/07/2020   Prediabetes 03/07/2020   Pseudophakia of both eyes 05/19/2019   PVC (premature ventricular contraction) 01/08/2017   Noted on event monitor 10/18  Formatting of  this note might be different from the original. Overview:  Noted on event monitor 10/18   Retinal tears, multiple, without detachment, left 10/09/2011   Status post intraocular lens implant 10/09/2011   Vitamin D deficiency 03/07/2020    Past Surgical History:  Procedure Laterality Date   BREAST LUMPECTOMY WITH RADIOACTIVE SEED LOCALIZATION Right 07/24/2017   Procedure: BREAST LUMPECTOMY WITH RADIOACTIVE SEED LOCALIZATION;  Surgeon: Glenna Fellows, MD;  Location: [facility];  Service: General;  Laterality: Right;   BREAST SURGERY     scar removed from left breast   CATARACT EXTRACTION Right    EYE SURGERY     retinia hole    Current Medications: Current Meds  Medication Sig   aspirin 81 MG tablet Take 81 mg by mouth daily.   atorvastatin (LIPITOR) 80 MG tablet TAKE ONE TABLET BY MOUTH EVERY EVENING   calcium carbonate (OS-CAL) 600 MG TABS tablet Take 600 mg by mouth 2 (two) times daily with a meal.   cholecalciferol (VITAMIN D) 1000 UNITS tablet Take 2,000 Units by mouth daily.   meclizine (ANTIVERT) 25 MG tablet Take 1 tablet by mouth as needed for dizziness.   Multiple Vitamins-Minerals (PRESERVISION AREDS 2 PO) Take 2 tablets by mouth daily.   propranolol ER (INDERAL LA) 80 MG 24 hr capsule TAKE ONE CAPSULE BY MOUTH DAILY   sertraline (ZOLOFT) 50 MG tablet Take 50 mg by mouth daily.   valsartan (DIOVAN) 80 MG tablet TAKE ONE TABLET BY MOUTH DAILY  Allergies:   Other and Wound dressing adhesive   Social History   Socioeconomic History   Marital status: Widowed    Spouse name: Not on file   Number of children: Not on file   Years of education: Not on file   Highest education level: Not on file  Occupational History   Not on file  Tobacco Use   Smoking status: Former    Passive exposure: Past   Smokeless tobacco: Never  Vaping Use   Vaping Use: Never used  Substance and Sexual Activity   Alcohol use: Never   Drug use: Never   Sexual activity: Not on file  Other Topics  Concern   Not on file  Social History Narrative   Not on file   Social Determinants of Health   Financial Resource Strain: Not on file  Food Insecurity: Not on file  Transportation Needs: Not on file  Physical Activity: Not on file  Stress: Not on file  Social Connections: Not on file     Family History: The patient's family history includes Healthy in her brother; Heart Problems in her brother; Heart attack in her father and mother. ROS:   Please see the history of present illness.    All 14 point review of systems negative except as described per history of present illness  EKGs/Labs/Other Studies Reviewed:      Recent Labs: No results found for requested labs within last 8760 hours.  Recent Lipid Panel No results found for: CHOL, TRIG, HDL, CHOLHDL, VLDL, LDLCALC, LDLDIRECT  Physical Exam:    VS:  BP 130/74 (BP Location: Right Arm)    Pulse 67    Ht 5' (1.524 m)    Wt 146 lb 1.3 oz (66.3 kg)    SpO2 97%    BMI 28.53 kg/m     Wt Readings from Last 3 Encounters:  04/25/21 146 lb 1.3 oz (66.3 kg)  03/08/20 146 lb (66.2 kg)  06/21/19 152 lb (68.9 kg)     GEN:  Well nourished, well developed in no acute distress HEENT: Normal NECK: No JVD; No carotid bruits LYMPHATICS: No lymphadenopathy CARDIAC: RRR, no murmurs, no rubs, no gallops RESPIRATORY:  Clear to auscultation without rales, wheezing or rhonchi  ABDOMEN: Soft, non-tender, non-distended MUSCULOSKELETAL:  No edema; No deformity  SKIN: Warm and dry LOWER EXTREMITIES: no swelling NEUROLOGIC:  Alert and oriented x 3 PSYCHIATRIC:  Normal affect   ASSESSMENT:    1. PVC (premature ventricular contraction)   2. Coronary artery disease involving native coronary artery of native heart without angina pectoris   3. Primary hypertension   4. Hypercholesterolemia   5. Atypical chest pain    PLAN:    In order of problems listed above:  PVCs denies having any.  We will continue monitoring. Elevated calcium  score with some new symptoms right now.  She is already on antiplatelet therapy which I will continue, she is on beta-blocker which I will continue statin, I will schedule him to have exercise Cardiolite to rule out any significant ischemia Essential hypertension: Blood pressure well controlled continue present management.  Dyslipidemia I did review her K PN data from 02/07/2020 showing LDL of 91 HDL 45 we will check direct LDL today.    Medication Adjustments/Labs and Tests Ordered: Current medicines are reviewed at length with the patient today.  Concerns regarding medicines are outlined above.  No orders of the defined types were placed in this encounter.  Medication changes: No orders of the  defined types were placed in this encounter.   Signed, Georgeanna Lea, MD, [facility] 04/25/2021 4:06 PM    [facility]

## 2021-04-25 NOTE — Patient Instructions (Addendum)
Medication Instructions:  Your physician recommends that you continue on your current medications as directed. Please refer to the Current Medication list given to you today.   *If you need a refill on your cardiac medications before your next appointment, please call your pharmacy*   Lab Work: Your physician recommends that you return for lab work in:   Labs today: Direct LDL  If you have labs (blood work) drawn today and your tests are completely normal, you will receive your results only by: MyChart Message (if you have MyChart) OR A paper copy in the mail If you have any lab test that is abnormal or we need to change your treatment, we will call you to review the results.   Testing/Procedures: Your physician has requested that you have en exercise stress myoview. For further information please visit [email or web address] Please follow instruction sheet, as given.    Follow-Up: At [facility], you and your health needs are our priority.  As part of our continuing mission to provide you with exceptional heart care, we have created designated Provider Care Teams.  These Care Teams include your primary Cardiologist (physician) and Advanced Practice Providers (APPs -  Physician Assistants and Nurse Practitioners) who all work together to provide you with the care you need, when you need it.  We recommend signing up for the patient portal called "MyChart".  Sign up information is provided on this After Visit Summary.  MyChart is used to connect with patients for Virtual Visits (Telemedicine).  Patients are able to view lab/test results, encounter notes, upcoming appointments, etc.  Non-urgent messages can be sent to your provider as well.   To learn more about what you can do with MyChart, go to [email or web address].    Your next appointment:   6 month(s)  The format for your next appointment:   In Person  Provider:   Gypsy Balsam, MD    Other Instructions None

## 2021-04-26 LAB — LDL CHOLESTEROL, DIRECT: LDL Direct: 108 mg/dL — ABNORMAL HIGH (ref 0–99)

## 2021-04-28 ENCOUNTER — Other Ambulatory Visit: Payer: Self-pay | Admitting: Cardiology

## 2021-04-29 ENCOUNTER — Other Ambulatory Visit: Payer: Self-pay

## 2021-04-29 DIAGNOSIS — R079 Chest pain, unspecified: Secondary | ICD-10-CM

## 2021-04-30 NOTE — Progress Notes (Signed)
v

## 2021-05-02 ENCOUNTER — Encounter (HOSPITAL_COMMUNITY): Payer: Self-pay | Admitting: Cardiology

## 2021-05-02 ENCOUNTER — Telehealth: Payer: Self-pay

## 2021-05-02 ENCOUNTER — Telehealth (HOSPITAL_COMMUNITY): Payer: Self-pay

## 2021-05-02 DIAGNOSIS — Z1211 Encounter for screening for malignant neoplasm of colon: Secondary | ICD-10-CM | POA: Diagnosis not present

## 2021-05-02 DIAGNOSIS — K5900 Constipation, unspecified: Secondary | ICD-10-CM | POA: Diagnosis not present

## 2021-05-02 DIAGNOSIS — K5730 Diverticulosis of large intestine without perforation or abscess without bleeding: Secondary | ICD-10-CM | POA: Diagnosis not present

## 2021-05-02 DIAGNOSIS — I2510 Atherosclerotic heart disease of native coronary artery without angina pectoris: Secondary | ICD-10-CM

## 2021-05-02 DIAGNOSIS — E7800 Pure hypercholesterolemia, unspecified: Secondary | ICD-10-CM

## 2021-05-02 DIAGNOSIS — Z800 Family history of malignant neoplasm of digestive organs: Secondary | ICD-10-CM | POA: Diagnosis not present

## 2021-05-02 DIAGNOSIS — Z86010 Personal history of colonic polyps: Secondary | ICD-10-CM | POA: Diagnosis not present

## 2021-05-02 MED ORDER — EZETIMIBE 10 MG PO TABS: 10.0000 mg | ORAL_TABLET | Freq: Every day | ORAL | 1 refills | Status: DC

## 2021-05-02 NOTE — Telephone Encounter (Signed)
Patient notified of results and recommendations and agreed with plan. Lab order on file, patient aware to fast for blood work. Rx sent to confirm pharmacy ?

## 2021-05-02 NOTE — Telephone Encounter (Signed)
-----   Message from Georgeanna Lea, MD sent at 04/29/2021 12:54 PM EST ----- ?Cholesterol still not well controlled.  I recommend add Zetia 10 mg daily to medical regimen, fasting abnormal AST LT 6 weeks ?

## 2021-05-02 NOTE — Telephone Encounter (Signed)
Detailed instructions left on the patient's answering machine. Asked to call back with any questions. S.Aubriella Perezgarcia EMTP 

## 2021-05-07 ENCOUNTER — Other Ambulatory Visit: Payer: Self-pay

## 2021-05-07 ENCOUNTER — Ambulatory Visit (HOSPITAL_COMMUNITY): Payer: Medicare Other | Attending: Cardiology

## 2021-05-07 DIAGNOSIS — R079 Chest pain, unspecified: Secondary | ICD-10-CM | POA: Diagnosis not present

## 2021-05-07 LAB — MYOCARDIAL PERFUSION IMAGING
Estimated workload: 7
Exercise duration (min): 7 min
Exercise duration (sec): 0 s
LV dias vol: 46 mL (ref 46–106)
LV sys vol: 14 mL
MPHR: 147 {beats}/min
Nuc Stress EF: 70 %
Peak HR: 134 {beats}/min
Percent HR: 91 %
Rest HR: 62 {beats}/min
Rest Nuclear Isotope Dose: 10.4 mCi
SDS: 0
SRS: 0
SSS: 0
ST Depression (mm): 0 mm
Stress Nuclear Isotope Dose: 31.7 mCi
TID: 0.88

## 2021-05-07 MED ORDER — TECHNETIUM TC 99M TETROFOSMIN IV KIT
10.4000 | PACK | Freq: Once | INTRAVENOUS | Status: AC | PRN
  Administered 2021-05-07: 10.4 via INTRAVENOUS
  Filled 2021-05-07: qty 11

## 2021-05-07 MED ORDER — REGADENOSON 0.4 MG/5ML IV SOLN: 0.4000 mg | Freq: Once | INTRAVENOUS | Status: AC

## 2021-05-07 MED ORDER — TECHNETIUM TC 99M TETROFOSMIN IV KIT
31.7000 | PACK | Freq: Once | INTRAVENOUS | Status: AC | PRN
  Administered 2021-05-07: 31.7 via INTRAVENOUS
  Filled 2021-05-07: qty 32

## 2021-06-11 ENCOUNTER — Telehealth: Payer: Self-pay | Admitting: Cardiology

## 2021-06-11 MED ORDER — EZETIMIBE 10 MG PO TABS: 10.0000 mg | ORAL_TABLET | Freq: Every day | ORAL | 2 refills | Status: DC

## 2021-06-11 NOTE — Telephone Encounter (Signed)
Refill of Zetia sent to [facility]. ?

## 2021-06-11 NOTE — Telephone Encounter (Signed)
?*  STAT* If patient is at the pharmacy, call can be transferred to refill team. ? ? ?1. Which medications need to be refilled? (please list name of each medication and dose if known)  ?ezetimibe (ZETIA) 10 MG tablet ? ?2. Which pharmacy/location (including street and city if local pharmacy) is medication to be sent to? ?[facility] - [place], [place] - [place] [id number] ? ?3. Do they need a 30 day or 90 day supply?  ?90 day supply ? ?

## 2021-06-18 DIAGNOSIS — Z1231 Encounter for screening mammogram for malignant neoplasm of breast: Secondary | ICD-10-CM | POA: Diagnosis not present

## 2021-07-02 DIAGNOSIS — K5900 Constipation, unspecified: Secondary | ICD-10-CM | POA: Diagnosis not present

## 2021-10-17 ENCOUNTER — Ambulatory Visit: Payer: Medicare Other | Admitting: Cardiology

## 2021-11-05 ENCOUNTER — Ambulatory Visit: Payer: Medicare Other | Admitting: Cardiology

## 2022-01-15 DIAGNOSIS — H35341 Macular cyst, hole, or pseudohole, right eye: Secondary | ICD-10-CM | POA: Diagnosis not present

## 2022-01-15 DIAGNOSIS — Z961 Presence of intraocular lens: Secondary | ICD-10-CM | POA: Diagnosis not present

## 2022-01-15 DIAGNOSIS — H18603 Keratoconus, unspecified, bilateral: Secondary | ICD-10-CM | POA: Diagnosis not present

## 2022-01-15 DIAGNOSIS — H353132 Nonexudative age-related macular degeneration, bilateral, intermediate dry stage: Secondary | ICD-10-CM | POA: Diagnosis not present

## 2022-01-15 DIAGNOSIS — H33332 Multiple defects of retina without detachment, left eye: Secondary | ICD-10-CM | POA: Diagnosis not present

## 2022-01-22 DIAGNOSIS — Z0000 Encounter for general adult medical examination without abnormal findings: Secondary | ICD-10-CM | POA: Diagnosis not present

## 2022-01-26 ENCOUNTER — Other Ambulatory Visit: Payer: Self-pay | Admitting: Cardiology

## 2022-01-27 NOTE — Telephone Encounter (Signed)
Rx refill sent to pharmacy. 

## 2022-02-17 DIAGNOSIS — R42 Dizziness and giddiness: Secondary | ICD-10-CM | POA: Diagnosis not present

## 2022-02-17 DIAGNOSIS — Z23 Encounter for immunization: Secondary | ICD-10-CM | POA: Diagnosis not present

## 2022-02-17 DIAGNOSIS — E7800 Pure hypercholesterolemia, unspecified: Secondary | ICD-10-CM | POA: Diagnosis not present

## 2022-02-17 DIAGNOSIS — R7303 Prediabetes: Secondary | ICD-10-CM | POA: Diagnosis not present

## 2022-02-17 DIAGNOSIS — E559 Vitamin D deficiency, unspecified: Secondary | ICD-10-CM | POA: Diagnosis not present

## 2022-02-17 DIAGNOSIS — I2510 Atherosclerotic heart disease of native coronary artery without angina pectoris: Secondary | ICD-10-CM | POA: Diagnosis not present

## 2022-02-17 DIAGNOSIS — M810 Age-related osteoporosis without current pathological fracture: Secondary | ICD-10-CM | POA: Diagnosis not present

## 2022-02-18 DIAGNOSIS — H0279 Other degenerative disorders of eyelid and periocular area: Secondary | ICD-10-CM | POA: Diagnosis not present

## 2022-02-18 DIAGNOSIS — H02413 Mechanical ptosis of bilateral eyelids: Secondary | ICD-10-CM | POA: Diagnosis not present

## 2022-02-18 DIAGNOSIS — H02423 Myogenic ptosis of bilateral eyelids: Secondary | ICD-10-CM | POA: Diagnosis not present

## 2022-02-18 DIAGNOSIS — H53483 Generalized contraction of visual field, bilateral: Secondary | ICD-10-CM | POA: Diagnosis not present

## 2022-02-18 DIAGNOSIS — H02831 Dermatochalasis of right upper eyelid: Secondary | ICD-10-CM | POA: Diagnosis not present

## 2022-02-18 DIAGNOSIS — H02834 Dermatochalasis of left upper eyelid: Secondary | ICD-10-CM | POA: Diagnosis not present

## 2022-02-18 DIAGNOSIS — H57813 Brow ptosis, bilateral: Secondary | ICD-10-CM | POA: Diagnosis not present

## 2022-02-21 DIAGNOSIS — U071 COVID-19: Secondary | ICD-10-CM | POA: Diagnosis not present

## 2022-02-21 DIAGNOSIS — R051 Acute cough: Secondary | ICD-10-CM | POA: Diagnosis not present

## 2022-02-21 DIAGNOSIS — E1169 Type 2 diabetes mellitus with other specified complication: Secondary | ICD-10-CM | POA: Diagnosis not present

## 2022-03-12 DIAGNOSIS — H53483 Generalized contraction of visual field, bilateral: Secondary | ICD-10-CM | POA: Diagnosis not present

## 2022-03-21 ENCOUNTER — Telehealth: Payer: Self-pay

## 2022-03-21 NOTE — Telephone Encounter (Signed)
Patient returned call

## 2022-03-21 NOTE — Telephone Encounter (Signed)
[facility] physician sent labs to Dr. Raliegh Ip however the patient has never seen Dr. Raliegh Ip. I called and LM.

## 2022-03-24 NOTE — Telephone Encounter (Signed)
Left message for the patient to call back.  

## 2022-04-01 DIAGNOSIS — K5730 Diverticulosis of large intestine without perforation or abscess without bleeding: Secondary | ICD-10-CM | POA: Diagnosis not present

## 2022-04-01 DIAGNOSIS — Z86010 Personal history of colonic polyps: Secondary | ICD-10-CM | POA: Diagnosis not present

## 2022-04-01 DIAGNOSIS — Z1211 Encounter for screening for malignant neoplasm of colon: Secondary | ICD-10-CM | POA: Diagnosis not present

## 2022-04-01 DIAGNOSIS — Z800 Family history of malignant neoplasm of digestive organs: Secondary | ICD-10-CM | POA: Diagnosis not present

## 2022-04-03 ENCOUNTER — Ambulatory Visit: Payer: Medicare Other | Admitting: Cardiology

## 2022-04-29 ENCOUNTER — Other Ambulatory Visit: Payer: Self-pay | Admitting: Cardiology

## 2022-04-30 DIAGNOSIS — Z1211 Encounter for screening for malignant neoplasm of colon: Secondary | ICD-10-CM | POA: Diagnosis not present

## 2022-04-30 DIAGNOSIS — Z86010 Personal history of colonic polyps: Secondary | ICD-10-CM | POA: Diagnosis not present

## 2022-04-30 DIAGNOSIS — K5730 Diverticulosis of large intestine without perforation or abscess without bleeding: Secondary | ICD-10-CM | POA: Diagnosis not present

## 2022-05-20 DIAGNOSIS — E1169 Type 2 diabetes mellitus with other specified complication: Secondary | ICD-10-CM | POA: Diagnosis not present

## 2022-05-22 DIAGNOSIS — H35341 Macular cyst, hole, or pseudohole, right eye: Secondary | ICD-10-CM | POA: Diagnosis not present

## 2022-05-22 DIAGNOSIS — H353132 Nonexudative age-related macular degeneration, bilateral, intermediate dry stage: Secondary | ICD-10-CM | POA: Diagnosis not present

## 2022-05-22 DIAGNOSIS — H18603 Keratoconus, unspecified, bilateral: Secondary | ICD-10-CM | POA: Diagnosis not present

## 2022-05-22 DIAGNOSIS — H33332 Multiple defects of retina without detachment, left eye: Secondary | ICD-10-CM | POA: Diagnosis not present

## 2022-05-28 ENCOUNTER — Other Ambulatory Visit: Payer: Self-pay | Admitting: Cardiology

## 2022-06-03 DIAGNOSIS — Z800 Family history of malignant neoplasm of digestive organs: Secondary | ICD-10-CM | POA: Insufficient documentation

## 2022-06-03 DIAGNOSIS — K5900 Constipation, unspecified: Secondary | ICD-10-CM | POA: Insufficient documentation

## 2022-06-03 HISTORY — DX: Family history of malignant neoplasm of digestive organs: Z80.0

## 2022-06-09 DIAGNOSIS — H02403 Unspecified ptosis of bilateral eyelids: Secondary | ICD-10-CM | POA: Diagnosis not present

## 2022-06-09 DIAGNOSIS — E1169 Type 2 diabetes mellitus with other specified complication: Secondary | ICD-10-CM | POA: Diagnosis not present

## 2022-06-10 ENCOUNTER — Encounter: Payer: Self-pay | Admitting: Cardiology

## 2022-06-10 ENCOUNTER — Ambulatory Visit: Payer: Medicare Other | Attending: Cardiology | Admitting: Cardiology

## 2022-06-10 VITALS — BP 144/62 | HR 58 | Ht 60.0 in | Wt 150.0 lb

## 2022-06-10 DIAGNOSIS — E7800 Pure hypercholesterolemia, unspecified: Secondary | ICD-10-CM | POA: Diagnosis not present

## 2022-06-10 DIAGNOSIS — I493 Ventricular premature depolarization: Secondary | ICD-10-CM

## 2022-06-10 DIAGNOSIS — I2510 Atherosclerotic heart disease of native coronary artery without angina pectoris: Secondary | ICD-10-CM

## 2022-06-10 MED ORDER — PROPRANOLOL HCL ER 80 MG PO CP24: ORAL_CAPSULE | ORAL | 2 refills | Status: DC

## 2022-06-10 MED ORDER — ATORVASTATIN CALCIUM 80 MG PO TABS: ORAL_TABLET | ORAL | 2 refills | Status: DC

## 2022-06-10 MED ORDER — VALSARTAN 80 MG PO TABS: ORAL_TABLET | ORAL | 2 refills | Status: DC

## 2022-06-10 MED ORDER — EZETIMIBE 10 MG PO TABS: ORAL_TABLET | ORAL | 2 refills | Status: DC

## 2022-06-10 NOTE — Patient Instructions (Signed)
Medication Instructions:  Your physician recommends that you continue on your current medications as directed. Please refer to the Current Medication list given to you today.  *If you need a refill on your cardiac medications before your next appointment, please call your pharmacy*   Lab Work: Your physician recommends that you return for lab work in: When fasting You need to have labs done when you are fasting.  You can come Monday through Friday 8:00 am to 4:00 pm- Closed 11:30-1:00 for lunch- You do not need to make an appointment as the order has already been placed. The labs you are going to have done are Lipids.    Testing/Procedures: None Ordered   Follow-Up: At [facility], you and your health needs are our priority.  As part of our continuing mission to provide you with exceptional heart care, we have created designated Provider Care Teams.  These Care Teams include your primary Cardiologist (physician) and Advanced Practice Providers (APPs -  Physician Assistants and Nurse Practitioners) who all work together to provide you with the care you need, when you need it.  We recommend signing up for the patient portal called "MyChart".  Sign up information is provided on this After Visit Summary.  MyChart is used to connect with patients for Virtual Visits (Telemedicine).  Patients are able to view lab/test results, encounter notes, upcoming appointments, etc.  Non-urgent messages can be sent to your provider as well.   To learn more about what you can do with MyChart, go to [email or web address].    Your next appointment:   8 month(s)  The format for your next appointment:   In Person  Provider:   Gypsy Balsam, MD    Other Instructions NA

## 2022-06-10 NOTE — Progress Notes (Unsigned)
Cardiology Office Note:    Date:  06/10/2022   ID:  Draizy Delasancha, DOB 1945/09/24, MRN [id number]  PCP:  Juluis Rainier, MD (Inactive)  Cardiologist:  Gypsy Balsam, MD    Referring MD: No ref. provider found   Chief Complaint  Patient presents with   Medication Refill    All cardiac meds     History of Present Illness:    Krista Contreras is a 77 y.o. female with past medical history significant for coronary artery disease as proven by calcium score being elevated 281, completely asymptomatic, family history of premature coronary artery disease, essential hypertension, dyslipidemia.  She comes today to months for follow-up overall doing well.  She denies have any chest pain tightness squeezing pressure burning chest.  Past Medical History:  Diagnosis Date   Abnormal cardiac function test 10/27/2014   Anxiety    Arthritis    Bilateral dry eyes 10/19/2017   BMI 28.0-28.9,adult 12/22/2017   CAD (coronary artery disease), native coronary artery    Coronary calcium score 281 July 2016     Depression    Dermatochalasis of both upper eyelids 10/19/2017   Diverticular disease of colon 03/07/2020   Early stage nonexudative age-related macular degeneration of both eyes 05/06/2018   Family history of malignant neoplasm of gastrointestinal tract 06/03/2022   Family history of premature coronary artery disease    Gas pain 12/22/2017   GERD (gastroesophageal reflux disease)    Heartburn 12/22/2017   Hypercholesterolemia    Hyperlipidemia    Hypertension    Impairment of balance 03/07/2020   Keratoconus, bilateral 04/09/2011   Macular hole, right 04/09/2011   Nuclear sclerosis, left 05/14/2012   Other reasons for seeking consultation 12/03/2011   Palpitations 03/07/2020   Prediabetes 03/07/2020   Pseudophakia of both eyes 05/19/2019   PVC (premature ventricular contraction) 01/08/2017   Noted on event monitor 10/18  Formatting of this note might be different from the  original. Overview:  Noted on event monitor 10/18   Retinal tears, multiple, without detachment, left 10/09/2011   Status post intraocular lens implant 10/09/2011   Vitamin D deficiency 03/07/2020    Past Surgical History:  Procedure Laterality Date   BREAST LUMPECTOMY WITH RADIOACTIVE SEED LOCALIZATION Right 07/24/2017   Procedure: BREAST LUMPECTOMY WITH RADIOACTIVE SEED LOCALIZATION;  Surgeon: Glenna Fellows, MD;  Location: [facility] OR;  Service: General;  Laterality: Right;   BREAST SURGERY     scar removed from left breast   CATARACT EXTRACTION Right    EYE SURGERY     retinia hole    Current Medications: Current Meds  Medication Sig   aspirin 81 MG tablet Take 81 mg by mouth daily.   atorvastatin (LIPITOR) 80 MG tablet 1 Tablet by mouth every evenings. 2nd attempt, please arrange appt for additional refills (Patient taking differently: Take 80 mg by mouth daily. 1 Tablet by mouth every evenings. 2nd attempt, please arrange appt for additional refills)   calcium carbonate (OS-CAL) 600 MG TABS tablet Take 600 mg by mouth 2 (two) times daily with a meal.   cholecalciferol (VITAMIN D) 1000 UNITS tablet Take 2,000 Units by mouth daily.   ezetimibe (ZETIA) 10 MG tablet 1 Tablet by mouth once daily. 2nd attempt, please arrange appt for additional refills (Patient taking differently: Take 10 mg by mouth daily. 1 Tablet by mouth once daily. 2nd attempt, please arrange appt for additional refills)   meclizine (ANTIVERT) 25 MG tablet Take 1 tablet by mouth as needed for dizziness.  Multiple Vitamins-Minerals (PRESERVISION AREDS 2 PO) Take 2 tablets by mouth daily.   propranolol ER (INDERAL LA) 80 MG 24 hr capsule 1 Tablet by mouth once daily. 2nd attempt, please arrange appt for additional refills (Patient taking differently: Take 80 mg by mouth daily. 1 Tablet by mouth once daily. 2nd attempt, please arrange appt for additional refills)   sertraline (ZOLOFT) 50 MG tablet Take 50 mg by mouth  daily.   valsartan (DIOVAN) 80 MG tablet 1 Tablet by mouth once daily. 2nd attempt, please arrange appt for additional refills (Patient taking differently: Take 80 mg by mouth daily. 1 Tablet by mouth once daily. 2nd attempt, please arrange appt for additional refills)     Allergies:   Other and Wound dressing adhesive   Social History   Socioeconomic History   Marital status: Widowed    Spouse name: Not on file   Number of children: Not on file   Years of education: Not on file   Highest education level: Not on file  Occupational History   Not on file  Tobacco Use   Smoking status: Former    Passive exposure: Past   Smokeless tobacco: Never  Vaping Use   Vaping Use: Never used  Substance and Sexual Activity   Alcohol use: Never   Drug use: Never   Sexual activity: Not on file  Other Topics Concern   Not on file  Social History Narrative   Not on file   Social Determinants of Health   Financial Resource Strain: Not on file  Food Insecurity: Not on file  Transportation Needs: Not on file  Physical Activity: Not on file  Stress: Not on file  Social Connections: Not on file     Family History: The patient's family history includes Healthy in her brother; Heart Problems in her brother; Heart attack in her father and mother. ROS:   Please see the history of present illness.    All 14 point review of systems negative except as described per history of present illness  EKGs/Labs/Other Studies Reviewed:      Recent Labs: No results found for requested labs within last 365 days.  Recent Lipid Panel    Component Value Date/Time   LDLDIRECT 108 (H) 04/25/2021 1639    Physical Exam:    VS:  BP (!) 144/62 (BP Location: Left Arm, Patient Position: Sitting)   Pulse (!) 58   Ht 5' (1.524 m)   Wt 150 lb (68 kg)   SpO2 96%   BMI 29.29 kg/m     Wt Readings from Last 3 Encounters:  06/10/22 150 lb (68 kg)  05/07/21 146 lb (66.2 kg)  04/25/21 146 lb 1.3 oz (66.3 kg)      GEN:  Well nourished, well developed in no acute distress HEENT: Normal NECK: No JVD; No carotid bruits LYMPHATICS: No lymphadenopathy CARDIAC: RRR, no murmurs, no rubs, no gallops RESPIRATORY:  Clear to auscultation without rales, wheezing or rhonchi  ABDOMEN: Soft, non-tender, non-distended MUSCULOSKELETAL:  No edema; No deformity  SKIN: Warm and dry LOWER EXTREMITIES: no swelling NEUROLOGIC:  Alert and oriented x 3 PSYCHIATRIC:  Normal affect   ASSESSMENT:    1. PVC (premature ventricular contraction)   2. Coronary artery disease involving native coronary artery of native heart without angina pectoris   3. Hypercholesterolemia    PLAN:    In order of problems listed above:  Elevated calcium score but completely asymptomatic we will continue risk factors modifications. PVCs: Denies have any  palpitations. Dyslipidemia with LDL 123.  I will ask her to have fasting lipid profile checked if her LDL is still elevated she need to be referred to our lipid clinic for consideration of PCSK9 agent. We did talk about healthy lifestyle need to exercise on the regular basis that she is trying to do   Medication Adjustments/Labs and Tests Ordered: Current medicines are reviewed at length with the patient today.  Concerns regarding medicines are outlined above.  No orders of the defined types were placed in this encounter.  Medication changes: No orders of the defined types were placed in this encounter.   Signed, Georgeanna Leaobert J. Karsyn Rochin, MD, [facility] 06/10/2022 4:43 PM    [facility]

## 2022-06-13 DIAGNOSIS — E7800 Pure hypercholesterolemia, unspecified: Secondary | ICD-10-CM | POA: Diagnosis not present

## 2022-06-14 LAB — LIPID PANEL
Chol/HDL Ratio: 3.8 ratio (ref 0.0–4.4)
Cholesterol, Total: 152 mg/dL (ref 100–199)
HDL: 40 mg/dL (ref >39)
LDL Chol Calc (NIH): 84 mg/dL (ref 0–99)
Triglycerides: 160 mg/dL — ABNORMAL HIGH (ref 0–149)
VLDL Cholesterol Cal: 28 mg/dL (ref 5–40)

## 2022-06-19 ENCOUNTER — Telehealth: Payer: Self-pay

## 2022-06-19 NOTE — Telephone Encounter (Signed)
-----   Message from Georgeanna Lea, MD sent at 06/18/2022  8:49 AM EDT ----- Cholesterol still I will be on the higher side, especially triglycerides for now continue present medications plans diet and exercise

## 2022-06-19 NOTE — Telephone Encounter (Signed)
Patient notified through my chart.

## 2022-06-23 DIAGNOSIS — H02423 Myogenic ptosis of bilateral eyelids: Secondary | ICD-10-CM | POA: Diagnosis not present

## 2022-06-23 DIAGNOSIS — H0279 Other degenerative disorders of eyelid and periocular area: Secondary | ICD-10-CM | POA: Diagnosis not present

## 2022-06-23 DIAGNOSIS — H02413 Mechanical ptosis of bilateral eyelids: Secondary | ICD-10-CM | POA: Diagnosis not present

## 2022-06-23 DIAGNOSIS — H02834 Dermatochalasis of left upper eyelid: Secondary | ICD-10-CM | POA: Diagnosis not present

## 2022-06-23 DIAGNOSIS — H53483 Generalized contraction of visual field, bilateral: Secondary | ICD-10-CM | POA: Diagnosis not present

## 2022-06-23 DIAGNOSIS — H57813 Brow ptosis, bilateral: Secondary | ICD-10-CM | POA: Diagnosis not present

## 2022-06-27 ENCOUNTER — Encounter: Payer: Self-pay | Admitting: Cardiology

## 2022-07-07 DIAGNOSIS — Z1231 Encounter for screening mammogram for malignant neoplasm of breast: Secondary | ICD-10-CM | POA: Diagnosis not present

## 2022-09-16 DIAGNOSIS — E1169 Type 2 diabetes mellitus with other specified complication: Secondary | ICD-10-CM | POA: Diagnosis not present

## 2022-10-01 DIAGNOSIS — H35341 Macular cyst, hole, or pseudohole, right eye: Secondary | ICD-10-CM | POA: Diagnosis not present

## 2022-10-01 DIAGNOSIS — H18603 Keratoconus, unspecified, bilateral: Secondary | ICD-10-CM | POA: Diagnosis not present

## 2022-10-01 DIAGNOSIS — Z961 Presence of intraocular lens: Secondary | ICD-10-CM | POA: Diagnosis not present

## 2022-10-01 DIAGNOSIS — H33332 Multiple defects of retina without detachment, left eye: Secondary | ICD-10-CM | POA: Diagnosis not present

## 2022-10-01 DIAGNOSIS — H353132 Nonexudative age-related macular degeneration, bilateral, intermediate dry stage: Secondary | ICD-10-CM | POA: Diagnosis not present

## 2022-10-20 DIAGNOSIS — H0279 Other degenerative disorders of eyelid and periocular area: Secondary | ICD-10-CM | POA: Diagnosis not present

## 2022-10-20 DIAGNOSIS — H02421 Myogenic ptosis of right eyelid: Secondary | ICD-10-CM | POA: Diagnosis not present

## 2022-10-31 DIAGNOSIS — H53483 Generalized contraction of visual field, bilateral: Secondary | ICD-10-CM | POA: Diagnosis not present

## 2022-12-23 DIAGNOSIS — H02423 Myogenic ptosis of bilateral eyelids: Secondary | ICD-10-CM | POA: Diagnosis not present

## 2022-12-23 DIAGNOSIS — H57813 Brow ptosis, bilateral: Secondary | ICD-10-CM | POA: Diagnosis not present

## 2022-12-23 DIAGNOSIS — H0279 Other degenerative disorders of eyelid and periocular area: Secondary | ICD-10-CM | POA: Diagnosis not present

## 2022-12-23 DIAGNOSIS — H02834 Dermatochalasis of left upper eyelid: Secondary | ICD-10-CM | POA: Diagnosis not present

## 2022-12-23 DIAGNOSIS — H02421 Myogenic ptosis of right eyelid: Secondary | ICD-10-CM | POA: Diagnosis not present

## 2023-01-26 DIAGNOSIS — Z9181 History of falling: Secondary | ICD-10-CM | POA: Diagnosis not present

## 2023-01-26 DIAGNOSIS — Z23 Encounter for immunization: Secondary | ICD-10-CM | POA: Diagnosis not present

## 2023-01-26 DIAGNOSIS — Z0000 Encounter for general adult medical examination without abnormal findings: Secondary | ICD-10-CM | POA: Diagnosis not present

## 2023-01-28 ENCOUNTER — Other Ambulatory Visit: Payer: Self-pay | Admitting: Cardiology

## 2023-02-02 NOTE — Telephone Encounter (Signed)
Rx refill sent to pharmacy. 

## 2023-02-19 DIAGNOSIS — I2510 Atherosclerotic heart disease of native coronary artery without angina pectoris: Secondary | ICD-10-CM | POA: Diagnosis not present

## 2023-02-19 DIAGNOSIS — E7800 Pure hypercholesterolemia, unspecified: Secondary | ICD-10-CM | POA: Diagnosis not present

## 2023-02-19 DIAGNOSIS — E559 Vitamin D deficiency, unspecified: Secondary | ICD-10-CM | POA: Diagnosis not present

## 2023-02-19 DIAGNOSIS — M810 Age-related osteoporosis without current pathological fracture: Secondary | ICD-10-CM | POA: Diagnosis not present

## 2023-02-19 DIAGNOSIS — E1169 Type 2 diabetes mellitus with other specified complication: Secondary | ICD-10-CM | POA: Diagnosis not present

## 2023-02-19 DIAGNOSIS — E119 Type 2 diabetes mellitus without complications: Secondary | ICD-10-CM | POA: Diagnosis not present

## 2023-02-19 LAB — BASIC METABOLIC PANEL: EGFR: 79

## 2023-02-19 LAB — HEMOGLOBIN A1C: A1c: 6.9

## 2023-04-01 DIAGNOSIS — Z860100 Personal history of colon polyps, unspecified: Secondary | ICD-10-CM | POA: Insufficient documentation

## 2023-04-03 ENCOUNTER — Encounter: Payer: Self-pay | Admitting: Cardiology

## 2023-04-03 ENCOUNTER — Ambulatory Visit: Payer: Medicare Other | Attending: Cardiology | Admitting: Cardiology

## 2023-04-03 VITALS — BP 120/76 | HR 65 | Ht 60.0 in | Wt 152.0 lb

## 2023-04-03 DIAGNOSIS — I493 Ventricular premature depolarization: Secondary | ICD-10-CM

## 2023-04-03 DIAGNOSIS — I2510 Atherosclerotic heart disease of native coronary artery without angina pectoris: Secondary | ICD-10-CM | POA: Diagnosis not present

## 2023-04-03 DIAGNOSIS — I10 Essential (primary) hypertension: Secondary | ICD-10-CM | POA: Diagnosis not present

## 2023-04-03 DIAGNOSIS — R7303 Prediabetes: Secondary | ICD-10-CM | POA: Diagnosis not present

## 2023-04-03 NOTE — Patient Instructions (Signed)
 Medication Instructions:  Your physician recommends that you continue on your current medications as directed. Please refer to the Current Medication list given to you today.  *If you need a refill on your cardiac medications before your next appointment, please call your pharmacy*   Lab Work: None Ordered If you have labs (blood work) drawn today and your tests are completely normal, you will receive your results only by: MyChart Message (if you have MyChart) OR A paper copy in the mail If you have any lab test that is abnormal or we need to change your treatment, we will call you to review the results.   Testing/Procedures: None Ordered   Follow-Up: At [facility], you and your health needs are our priority.  As part of our continuing mission to provide you with exceptional heart care, we have created designated Provider Care Teams.  These Care Teams include your primary Cardiologist (physician) and Advanced Practice Providers (APPs -  Physician Assistants and Nurse Practitioners) who all work together to provide you with the care you need, when you need it.  We recommend signing up for the patient portal called "MyChart".  Sign up information is provided on this After Visit Summary.  MyChart is used to connect with patients for Virtual Visits (Telemedicine).  Patients are able to view lab/test results, encounter notes, upcoming appointments, etc.  Non-urgent messages can be sent to your provider as well.   To learn more about what you can do with MyChart, go to [email or web address].    Your next appointment:   12 month(s)  The format for your next appointment:   In Person  Provider:   Gypsy Balsam, MD    Other Instructions NA

## 2023-04-03 NOTE — Progress Notes (Signed)
Cardiology Office Note:    Date:  04/03/2023   ID:  Krista Contreras, DOB 1945/09/02, MRN [id number]  PCP:  Juluis Rainier, MD (Inactive)  Cardiologist:  Gypsy Balsam, MD    Referring MD: No ref. provider found   Chief Complaint  Patient presents with   Follow-up    History of Present Illness:    Krista Contreras is a 78 y.o. female   Past medical history significant for coronary artery disease she did have calcium score done which showed 281, LAD lesion is completely asymptomatic, does have family history of premature coronary artery disease, essential hypertension, dyslipidemia recently recognized diabetes.  Comes today to months for follow-up overall doing well she is disappointed with the fact she developed diabetes.  Denies have any chest pain tightness squeezing pressure burning chest, overall no cardiac symptoms.  Past Medical History:  Diagnosis Date   Abnormal cardiac function test 10/27/2014   Anxiety    Arthritis    Bilateral dry eyes 10/19/2017   BMI 28.0-28.9,adult 12/22/2017   CAD (coronary artery disease), native coronary artery    Coronary calcium score 281 July 2016     Depression    Dermatochalasis of both upper eyelids 10/19/2017   Diverticular disease of colon 03/07/2020   Early stage nonexudative age-related macular degeneration of both eyes 05/06/2018   Family history of malignant neoplasm of gastrointestinal tract 06/03/2022   Family history of premature coronary artery disease    Gas pain 12/22/2017   GERD (gastroesophageal reflux disease)    Heartburn 12/22/2017   Hypercholesterolemia    Hyperlipidemia    Hypertension    Impairment of balance 03/07/2020   Keratoconus, bilateral 04/09/2011   Macular hole, right 04/09/2011   Nuclear sclerosis, left 05/14/2012   Other reasons for seeking consultation 12/03/2011   Palpitations 03/07/2020   Prediabetes 03/07/2020   Pseudophakia of both eyes 05/19/2019   PVC (premature ventricular contraction)  01/08/2017   Noted on event monitor 10/18  Formatting of this note might be different from the original. Overview:  Noted on event monitor 10/18   Retinal tears, multiple, without detachment, left 10/09/2011   Status post intraocular lens implant 10/09/2011   Vitamin D deficiency 03/07/2020    Past Surgical History:  Procedure Laterality Date   BREAST LUMPECTOMY WITH RADIOACTIVE SEED LOCALIZATION Right 07/24/2017   Procedure: BREAST LUMPECTOMY WITH RADIOACTIVE SEED LOCALIZATION;  Surgeon: Glenna Fellows, MD;  Location: [facility] OR;  Service: General;  Laterality: Right;   BREAST SURGERY     scar removed from left breast   CATARACT EXTRACTION Right    EYE SURGERY     retinia hole    Current Medications: Current Meds  Medication Sig   aspirin 81 MG tablet Take 81 mg by mouth daily.   atorvastatin (LIPITOR) 80 MG tablet TAKE 1 TABLET BY MOUTH EVERY EVENING (Patient taking differently: Take 80 mg by mouth daily. TAKE 1 TABLET BY MOUTH EVERY EVENING)   calcium carbonate (OS-CAL) 600 MG TABS tablet Take 600 mg by mouth 2 (two) times daily with a meal.   cholecalciferol (VITAMIN D) 1000 UNITS tablet Take 2,000 Units by mouth daily.   ezetimibe (ZETIA) 10 MG tablet TAKE 1 TABLET BY MOUTH ONCE DAILY (Patient taking differently: Take 10 mg by mouth daily. TAKE 1 TABLET BY MOUTH ONCE DAILY)   meclizine (ANTIVERT) 25 MG tablet Take 1 tablet by mouth as needed for dizziness.   metFORMIN (GLUCOPHAGE) 500 MG tablet Take 500 mg by mouth daily with breakfast.  Multiple Vitamins-Minerals (PRESERVISION AREDS 2 PO) Take 2 tablets by mouth daily.   propranolol ER (INDERAL LA) 80 MG 24 hr capsule TAKE 1 CAPSULE BY MOUTH ONCE DAILY (Patient taking differently: Take 80 mg by mouth daily. TAKE 1 CAPSULE BY MOUTH ONCE DAILY)   sertraline (ZOLOFT) 50 MG tablet Take 50 mg by mouth daily.   valsartan (DIOVAN) 80 MG tablet TAKE 1 TABLET BY MOUTH ONCE DAILY (Patient taking differently: Take 80 mg by mouth 2 (two)  times daily. TAKE 1 TABLET BY MOUTH ONCE DAILY)     Allergies:   Other and Wound dressing adhesive   Social History   Socioeconomic History   Marital status: Widowed    Spouse name: Not on file   Number of children: Not on file   Years of education: Not on file   Highest education level: Not on file  Occupational History   Not on file  Tobacco Use   Smoking status: Former    Passive exposure: Past   Smokeless tobacco: Never  Vaping Use   Vaping status: Never Used  Substance and Sexual Activity   Alcohol use: Never   Drug use: Never   Sexual activity: Not on file  Other Topics Concern   Not on file  Social History Narrative   Not on file   Social Drivers of Health   Financial Resource Strain: Not on file  Food Insecurity: Not on file  Transportation Needs: Not on file  Physical Activity: Not on file  Stress: Not on file  Social Connections: Not on file     Family History: The patient's family history includes Healthy in her brother; Heart Problems in her brother; Heart attack in her father and mother. ROS:   Please see the history of present illness.    All 14 point review of systems negative except as described per history of present illness  EKGs/Labs/Other Studies Reviewed:         Recent Labs: No results found for requested labs within last 365 days.  Recent Lipid Panel    Component Value Date/Time   CHOL 152 06/13/2022 0846   TRIG 160 (H) 06/13/2022 0846   HDL 40 06/13/2022 0846   CHOLHDL 3.8 06/13/2022 0846   LDLCALC 84 06/13/2022 0846   LDLDIRECT 108 (H) 04/25/2021 1639    Physical Exam:    VS:  BP 120/76 (BP Location: Left Arm, Patient Position: Sitting)   Pulse 65   Ht 5' (1.524 m)   Wt 152 lb (68.9 kg)   SpO2 93%   BMI 29.69 kg/m     Wt Readings from Last 3 Encounters:  04/03/23 152 lb (68.9 kg)  06/10/22 150 lb (68 kg)  05/07/21 146 lb (66.2 kg)     GEN:  Well nourished, well developed in no acute distress HEENT: Normal NECK:  No JVD; No carotid bruits LYMPHATICS: No lymphadenopathy CARDIAC: RRR, no murmurs, no rubs, no gallops RESPIRATORY:  Clear to auscultation without rales, wheezing or rhonchi  ABDOMEN: Soft, non-tender, non-distended MUSCULOSKELETAL:  No edema; No deformity  SKIN: Warm and dry LOWER EXTREMITIES: no swelling NEUROLOGIC:  Alert and oriented x 3 PSYCHIATRIC:  Normal affect   ASSESSMENT:    1. Coronary artery disease involving native coronary artery of native heart without angina pectoris   2. Primary hypertension   3. PVC (premature ventricular contraction)   4. Prediabetes    PLAN:    In order of problems listed above:  New diagnosis of diabetes.  We  spent great of time talking about this I told her that this is type 2 diabetes exercise will be tremendously important to increase volume of her muscle mass.  She promised to do that. Coronary artery disease asymptomatic on guideline directed medical therapy which I will continue. Dyslipidemia I did review her K PN which show me her LDL of 73 HDL 42.  She is taking 80 mg Lipitor which I continue.  She is also on antiplatelets therapy. PVCs denies having palpitations   Medication Adjustments/Labs and Tests Ordered: Current medicines are reviewed at length with the patient today.  Concerns regarding medicines are outlined above.  No orders of the defined types were placed in this encounter.  Medication changes: No orders of the defined types were placed in this encounter.   Signed, Georgeanna Lea, MD, [facility] 04/03/2023 4:58 PM    [facility]

## 2023-04-20 ENCOUNTER — Other Ambulatory Visit: Payer: Self-pay | Admitting: Cardiology

## 2023-04-21 NOTE — Telephone Encounter (Signed)
 Prescriptions sent to pharmacy

## 2023-06-10 DIAGNOSIS — E1169 Type 2 diabetes mellitus with other specified complication: Secondary | ICD-10-CM | POA: Diagnosis not present

## 2023-06-11 DIAGNOSIS — H18603 Keratoconus, unspecified, bilateral: Secondary | ICD-10-CM | POA: Diagnosis not present

## 2023-06-11 DIAGNOSIS — H353132 Nonexudative age-related macular degeneration, bilateral, intermediate dry stage: Secondary | ICD-10-CM | POA: Diagnosis not present

## 2023-06-11 DIAGNOSIS — H35341 Macular cyst, hole, or pseudohole, right eye: Secondary | ICD-10-CM | POA: Diagnosis not present

## 2023-06-11 DIAGNOSIS — E119 Type 2 diabetes mellitus without complications: Secondary | ICD-10-CM | POA: Diagnosis not present

## 2023-06-11 DIAGNOSIS — H33332 Multiple defects of retina without detachment, left eye: Secondary | ICD-10-CM | POA: Diagnosis not present

## 2023-07-13 DIAGNOSIS — Z1231 Encounter for screening mammogram for malignant neoplasm of breast: Secondary | ICD-10-CM | POA: Diagnosis not present

## 2023-10-01 DIAGNOSIS — I2510 Atherosclerotic heart disease of native coronary artery without angina pectoris: Secondary | ICD-10-CM | POA: Diagnosis not present

## 2023-10-01 DIAGNOSIS — E1169 Type 2 diabetes mellitus with other specified complication: Secondary | ICD-10-CM | POA: Diagnosis not present

## 2023-10-01 DIAGNOSIS — M810 Age-related osteoporosis without current pathological fracture: Secondary | ICD-10-CM | POA: Diagnosis not present

## 2023-10-01 DIAGNOSIS — E7800 Pure hypercholesterolemia, unspecified: Secondary | ICD-10-CM | POA: Diagnosis not present

## 2023-11-01 DIAGNOSIS — M810 Age-related osteoporosis without current pathological fracture: Secondary | ICD-10-CM | POA: Diagnosis not present

## 2023-11-01 DIAGNOSIS — E1169 Type 2 diabetes mellitus with other specified complication: Secondary | ICD-10-CM | POA: Diagnosis not present

## 2023-11-01 DIAGNOSIS — I2510 Atherosclerotic heart disease of native coronary artery without angina pectoris: Secondary | ICD-10-CM | POA: Diagnosis not present

## 2023-11-01 DIAGNOSIS — E7800 Pure hypercholesterolemia, unspecified: Secondary | ICD-10-CM | POA: Diagnosis not present

## 2023-12-01 DIAGNOSIS — E1169 Type 2 diabetes mellitus with other specified complication: Secondary | ICD-10-CM | POA: Diagnosis not present

## 2023-12-01 DIAGNOSIS — E7800 Pure hypercholesterolemia, unspecified: Secondary | ICD-10-CM | POA: Diagnosis not present

## 2023-12-01 DIAGNOSIS — I2510 Atherosclerotic heart disease of native coronary artery without angina pectoris: Secondary | ICD-10-CM | POA: Diagnosis not present

## 2023-12-01 DIAGNOSIS — M810 Age-related osteoporosis without current pathological fracture: Secondary | ICD-10-CM | POA: Diagnosis not present

## 2023-12-07 DIAGNOSIS — E1169 Type 2 diabetes mellitus with other specified complication: Secondary | ICD-10-CM | POA: Diagnosis not present

## 2023-12-07 DIAGNOSIS — Z23 Encounter for immunization: Secondary | ICD-10-CM | POA: Diagnosis not present

## 2024-01-13 ENCOUNTER — Other Ambulatory Visit: Payer: Self-pay | Admitting: Cardiology

## 2024-04-05 ENCOUNTER — Ambulatory Visit: Admitting: Cardiology

## 2024-06-01 ENCOUNTER — Ambulatory Visit: Admitting: Cardiology
